# Patient Record
Sex: Male | Born: 1970
Health system: Southern US, Community
[De-identification: ages and names within clinical notes are randomized; demographics above are authoritative.]

## PROBLEM LIST (undated history)

## (undated) DIAGNOSIS — Q676 Pectus excavatum: Secondary | ICD-10-CM

## (undated) DIAGNOSIS — M722 Plantar fascial fibromatosis: Secondary | ICD-10-CM

## (undated) DIAGNOSIS — I10 Essential (primary) hypertension: Secondary | ICD-10-CM

## (undated) DIAGNOSIS — I639 Cerebral infarction, unspecified: Secondary | ICD-10-CM

## (undated) HISTORY — PX: WISDOM TOOTH EXTRACTION: SHX21

## (undated) HISTORY — PX: PECTUS EXCAVATUM REPAIR: SHX437

---

## 2011-05-30 HISTORY — PX: HERNIA REPAIR: SHX51

## 2011-12-14 ENCOUNTER — Ambulatory Visit: Payer: Self-pay | Admitting: Surgery

## 2011-12-21 ENCOUNTER — Ambulatory Visit: Payer: Self-pay | Admitting: Surgery

## 2012-08-30 ENCOUNTER — Ambulatory Visit: Payer: Self-pay | Admitting: Internal Medicine

## 2014-03-25 IMAGING — CT CT ABD-PELV W/ CM
1 of 2 series · 15 of 32 positions shown, 19 images · non-contrast
Comparison: none

REASON FOR EXAM: abd and pelvic pain
COMMENTS:

[Series 2: abd 3mm w 3.0 i40f 3 · axial · 0.77mm/px · z∈[-1061,-608]mm · 15 of 165 slices shown, 19 images]
[im 7/165  soft-tissue]
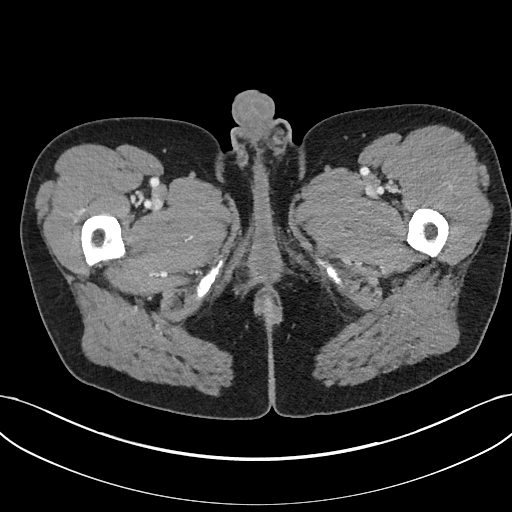
[im 7/165  bone]
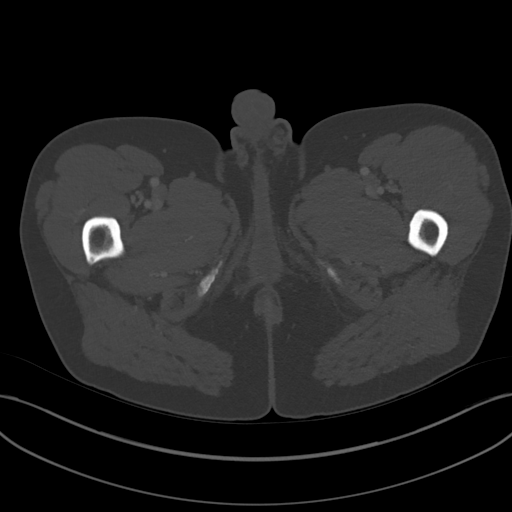
[im 21/165  soft-tissue]
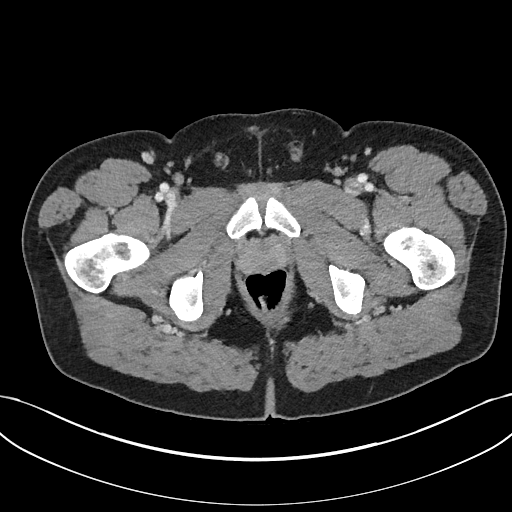
[im 35/165  soft-tissue]
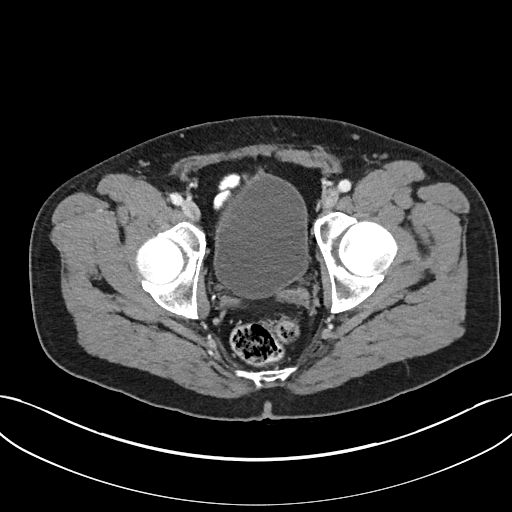
[im 48/165  soft-tissue]
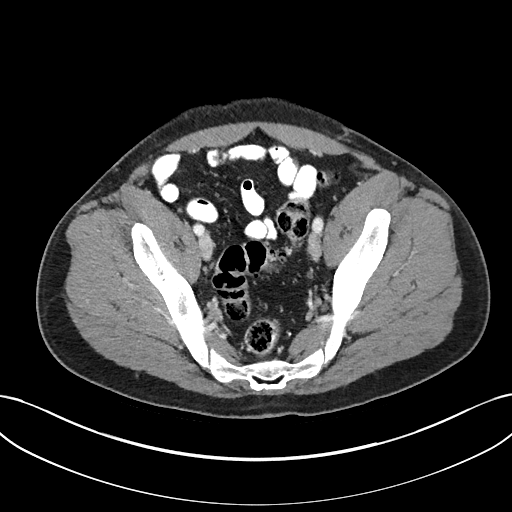
[im 55/165  soft-tissue]
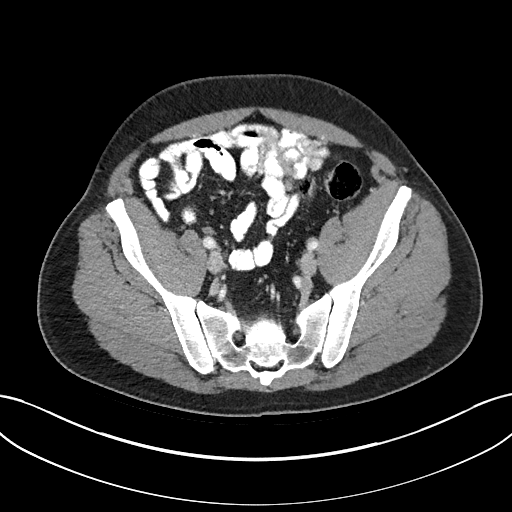
[im 69/165  soft-tissue]
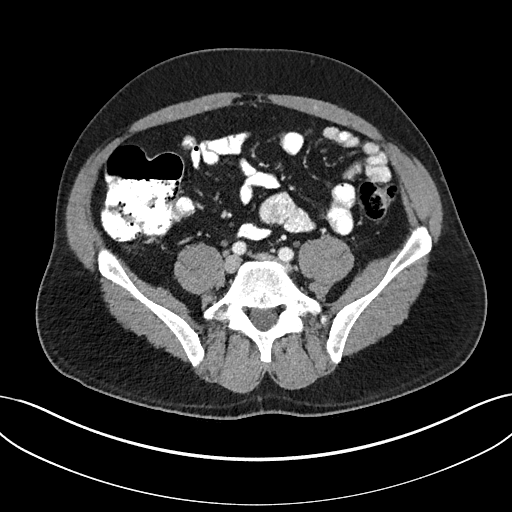
[im 83/165  soft-tissue]
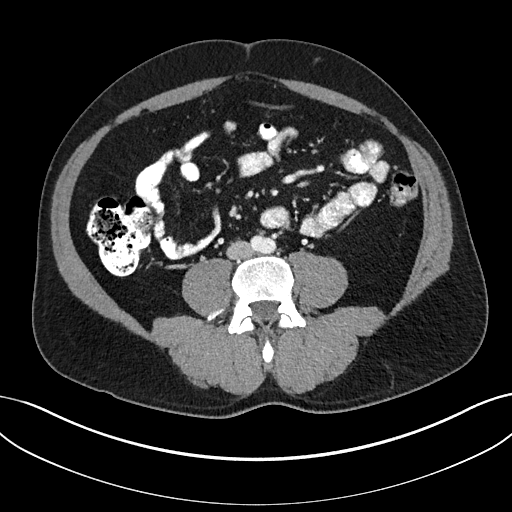
[im 96/165  soft-tissue]
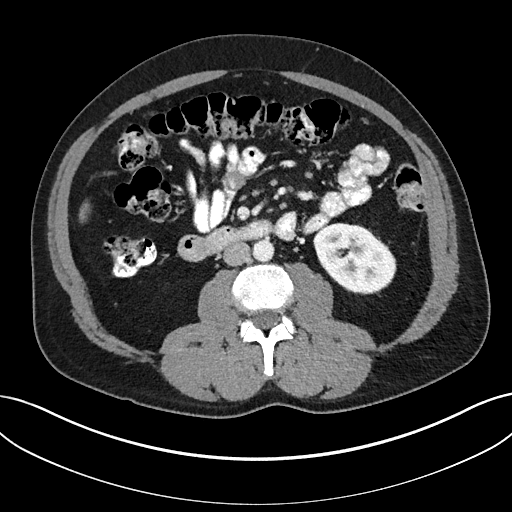
[im 110/165  soft-tissue]
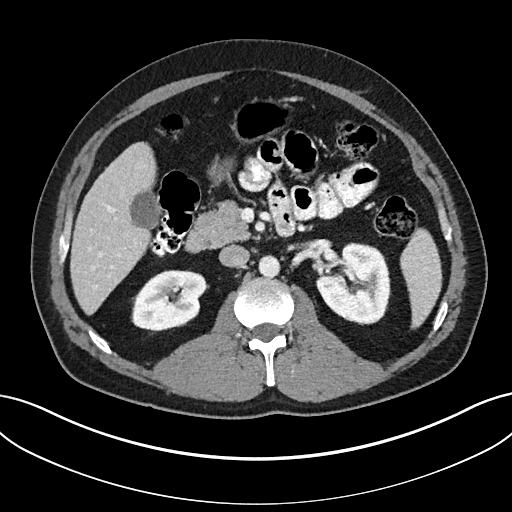
[im 110/165  bone]
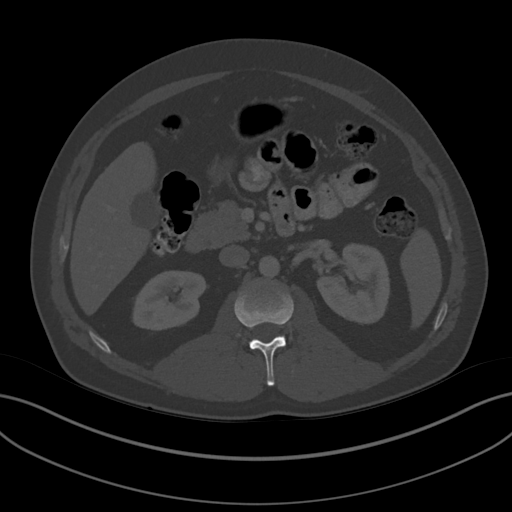
[im 117/165  soft-tissue]
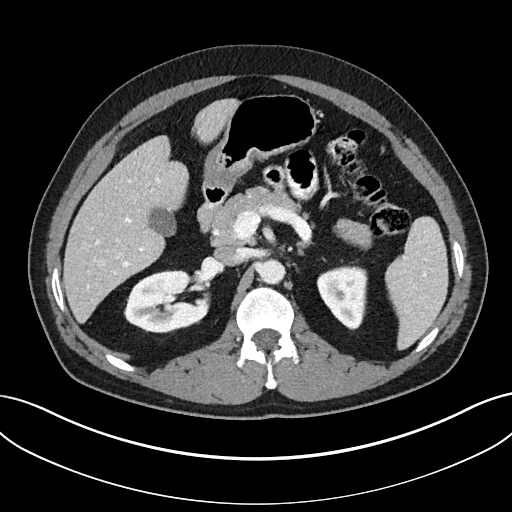
[im 130/165  soft-tissue]
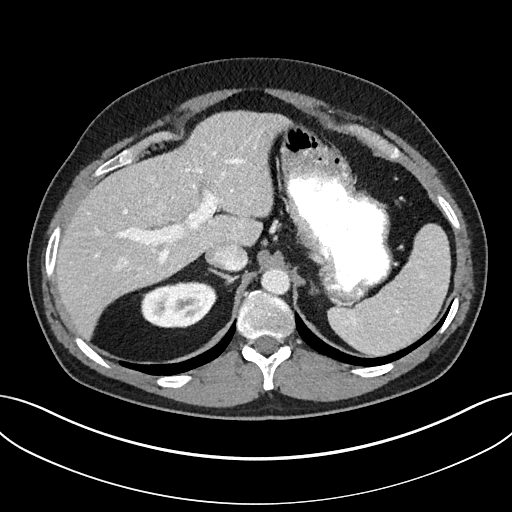
[im 137/165  lung]
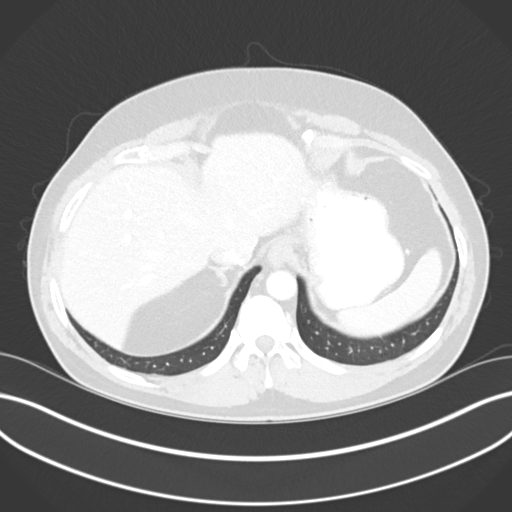
[im 144/165  soft-tissue]
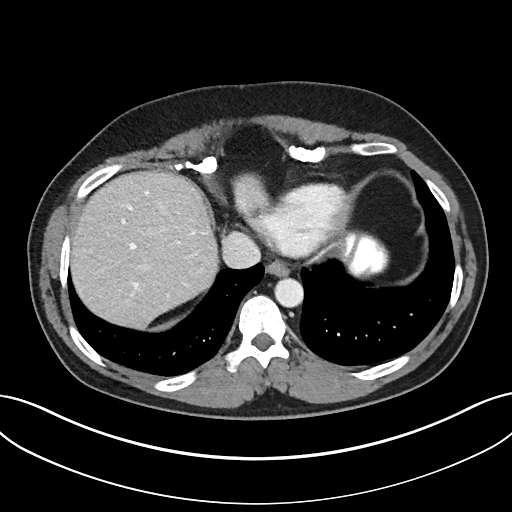
[im 144/165  lung]
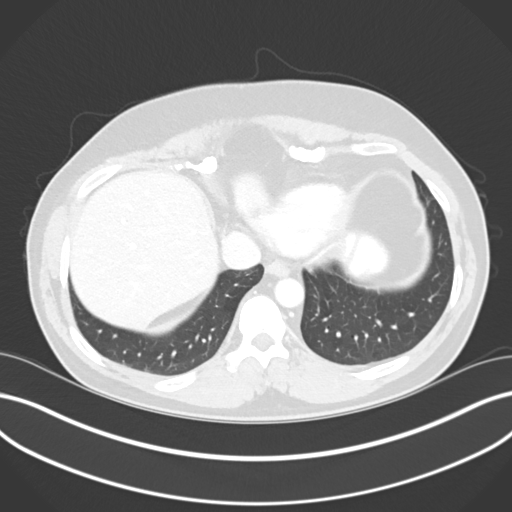
[im 151/165  lung]
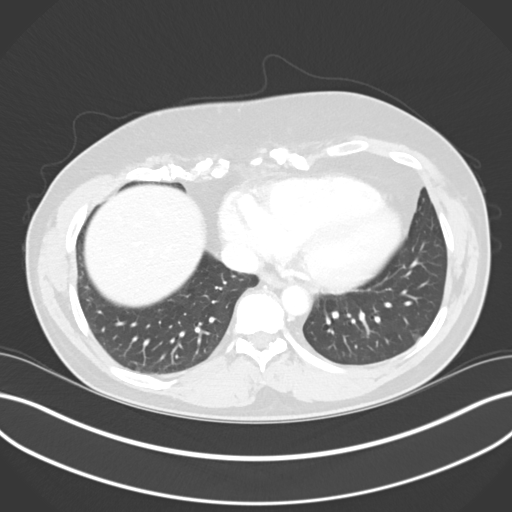
[im 158/165  soft-tissue]
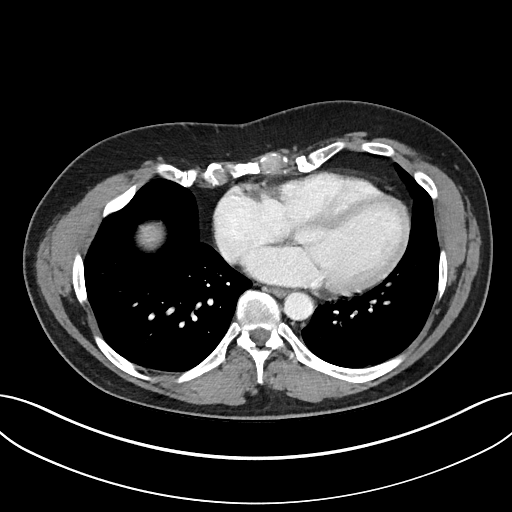
[im 158/165  lung]
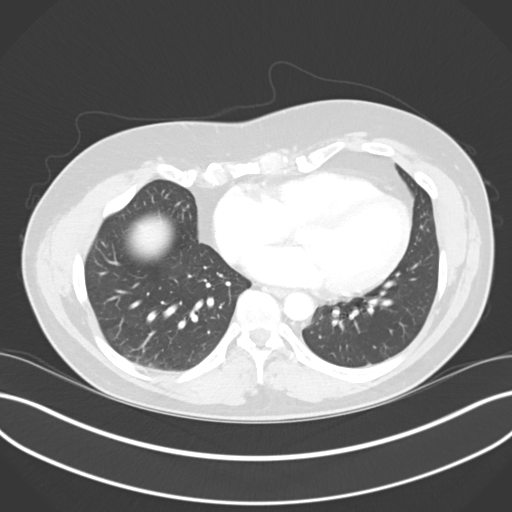

[15 of 32 positions shown; findings below may reference images not displayed]

PROCEDURE:     KCT - KCT ABDOMEN/PELVIS W  - August 30, 2012 [DATE]

RESULT:     Axial CT scanning was performed through the abdomen and pelvis
with reconstructions at 3 mm intervals lies. The patient received 100 cc of
Ssovue-4TO and also received oral contrast material. Review of multiplanar
reconstructed images was performed separately on the VIA monitor.

The liver, gallbladder, spleen, pancreas, partially distended stomach,
adrenal glands, and kidneys are normal in appearance. The caliber of the
abdominal aorta is normal. There is no periaortic nor pericaval
lymphadenopathy.

The partially contrast-filled loops of small and large bowel are normal in
appearance. A normal calibered uninflamed appendix is demonstrated. There is
no evidence of acute colitis or diverticulitis. There is no evidence of a
recurrent inguinal hernia. There is a small fat containing umbilical hernia.
The partially distended urinary bladder is normal in appearance. The
prostate gland and seminal vesicles are normal in appearance for age. There
are phleboliths within the pelvis. No urinary tract stone is demonstrated.

The lung bases exhibit no acute abnormalities. The lumbar vertebral bodies
are preserved in height.
IMPRESSION: 1. There is no evidence of a recurrent inguinal hernia. No acute abnormality
of the small or large bowel is demonstrated. There is a small fat containing
umbilical hernia.
2. There is no evidence of acute urinary tract abnormality.
3. There is no acute hepatobiliary abnormality.

[REDACTED]

## 2014-09-15 NOTE — Op Note (Signed)
PATIENT NAME:  Roger Reeves, Roger Reeves MR#:  [id number] DATE OF BIRTH:  May 16, 1971  DATE OF PROCEDURE:  12/21/2011  PREOPERATIVE DIAGNOSIS: Bilateral inguinal hernias.   POSTOPERATIVE DIAGNOSIS: Bilateral inguinal hernias.   PROCEDURE: Bilateral inguinal hernia repair.  SURGEON: Rochel Brome, MD  ANESTHESIA: General, local.   INDICATIONS: This 44 year old male has a history of some pain in the left groin. He had physical findings of bilateral inguinal hernias and repair was recommended for definitive treatment. The patient also had previous history of fever related to general anesthesia, at the age of 31, and it was elected to do this with local with intravenous sedation hopefully to avoid malignant hyperthermia.   DESCRIPTION OF PROCEDURE: The patient was placed on the operating table in the supine position and sedated. The hair was clipped. The abdomen was prepared with ChloraPrep and draped in a sterile manner.   The sites for the incisions were marked with ink so that we made bilateral transversely oriented suprapubic incisions beginning initially on the left side.   The skin was infiltrated with 1% Xylocaine with epinephrine. The transversely oriented left suprapubic incision was made and carried down through subcutaneous tissues. One traversing vein was divided between 4-0 Vicryl ligatures. Additional Xylocaine with epinephrine was injected into the Scarpa's fascia. The Scarpa's fascia was incised and dissection was carried down to the external oblique aponeurosis. This was injected with Xylocaine with epinephrine superior and lateral to the repair site. Next, the external ring was identified. The external oblique aponeurosis was incised along the course of its fibers to open the external ring and expose the inguinal cord structures. The inguinal cord structures and ilioinguinal nerve were mobilized. The cremaster fibers were spread examining the cord structures. There was a small cord lipoma but no  indirect component. There was however a finding of a large direct inguinal hernia with a large defect. The small cord lipoma was dissected up to the internal ring and ligated with 4-0 Vicryl and amputated. Next, the direct inguinal hernia was further delineated and its bulge was approximately 4 cm. It was inverted and repair was carried out with a row of 0 Surgilon sutures suturing the conjoined tendon to the shelving edge of the inguinal ligament incorporating transversalis fascia into the repair. This suture line began at the pubic tubercle and was carried up to the internal ring. The repair looked good. A relaxing incision was made medially in the internal oblique fascia. Next, an onlay Atrium mesh was cut to create an oval shape of some 2.5 x 3.5 cm in dimension. A U-shaped notch was cut out for the internal ring. The mesh was placed onto the floor of the inguinal canal. It was sutured on both sides of the internal ring and also sutured to the repair and also sutured medially to the medial aspect of the relaxing incision. The repair looked good. Hemostasis was intact. Cord structures and ilioinguinal nerve were replaced along the floor of the inguinal canal. Cut edges of the external oblique aponeurosis were closed with a running 4-0 Vicryl. Next, 10 mL of 0.5% Sensorcaine was injected into the deep fascia superior and lateral to the repair site and also subcutaneous tissues were infiltrated with another 5 mL. Next, the Scarpa's fascia was closed with interrupted 4-0 Vicryl. The skin was closed with running 4-0 Monocryl subcuticular suture.   The patient was in satisfactory condition and attention was turned to the right side where the skin was infiltrated with Xylocaine with epinephrine and a mirror image  incision was made and carried down through subcutaneous tissues. One traversing vein was divided between 4-0 Vicryl ligatures. Scarpa's fascia was injected and incised. The external oblique aponeurosis was  injected likewise and incised along the course of its fibers to open the external ring and expose the inguinal cord structures. These structures and the ilioinguinal nerve were mobilized. There was a somewhat smaller direct inguinal hernia, no significant cord lipoma, no indirect component. This direct inguinal hernia was also inverted and repaired with a row of 0 Surgilon sutures suturing the conjoined tendon to the shelving edge of the inguinal ligament incorporating transversalis fascia into the repair. A relaxing incision was made medially and a similar mesh was cut and sewn into place with 0 Surgilon sutures. The repair looked good. Cord structures and ilioinguinal nerve were replaced along the floor of the inguinal canal. Cut edges of the external oblique aponeurosis were closed with running 4-0 Vicryl. The fascia superior and lateral to the repair site was infiltrated with Sensorcaine, also subcutaneous tissues infiltrated. Scarpa's fascia was closed with interrupted 4-0 Vicryl. The skin was closed with running 4-0 Monocryl subcuticular suture.          Both wounds were then dressed with Dermabond and allowed to dry. The patient tolerated the procedure satisfactorily. Testicles remained in the scrotum. The patient was prepared for transfer to the recovery room. ____________________________ Lenna Sciara. Rochel Brome, MD jws:slb D: 12/21/2011 12:26:26 ET T: 12/21/2011 12:50:02 ET JOB#: [id number]  cc: Loreli Dollar, MD, <Dictator> Loreli Dollar MD ELECTRONICALLY SIGNED 12/21/2011 22:02

## 2015-06-25 DIAGNOSIS — R079 Chest pain, unspecified: Secondary | ICD-10-CM | POA: Diagnosis not present

## 2015-06-25 DIAGNOSIS — R0789 Other chest pain: Secondary | ICD-10-CM | POA: Diagnosis not present

## 2015-06-25 DIAGNOSIS — R0781 Pleurodynia: Secondary | ICD-10-CM | POA: Diagnosis not present

## 2015-06-25 DIAGNOSIS — I10 Essential (primary) hypertension: Secondary | ICD-10-CM | POA: Diagnosis not present

## 2015-10-11 DIAGNOSIS — I10 Essential (primary) hypertension: Secondary | ICD-10-CM | POA: Diagnosis not present

## 2015-10-11 DIAGNOSIS — Z79899 Other long term (current) drug therapy: Secondary | ICD-10-CM | POA: Diagnosis not present

## 2015-10-11 DIAGNOSIS — Z125 Encounter for screening for malignant neoplasm of prostate: Secondary | ICD-10-CM | POA: Diagnosis not present

## 2015-10-11 DIAGNOSIS — E7800 Pure hypercholesterolemia, unspecified: Secondary | ICD-10-CM | POA: Diagnosis not present

## 2015-10-11 DIAGNOSIS — Z0000 Encounter for general adult medical examination without abnormal findings: Secondary | ICD-10-CM | POA: Diagnosis not present

## 2015-10-27 DIAGNOSIS — I10 Essential (primary) hypertension: Secondary | ICD-10-CM | POA: Diagnosis not present

## 2015-10-27 DIAGNOSIS — R0609 Other forms of dyspnea: Secondary | ICD-10-CM | POA: Diagnosis not present

## 2015-12-14 DIAGNOSIS — R0602 Shortness of breath: Secondary | ICD-10-CM | POA: Diagnosis not present

## 2015-12-14 DIAGNOSIS — R5383 Other fatigue: Secondary | ICD-10-CM | POA: Diagnosis not present

## 2015-12-14 DIAGNOSIS — R5381 Other malaise: Secondary | ICD-10-CM | POA: Diagnosis not present

## 2015-12-14 DIAGNOSIS — I10 Essential (primary) hypertension: Secondary | ICD-10-CM | POA: Diagnosis not present

## 2015-12-29 DIAGNOSIS — I10 Essential (primary) hypertension: Secondary | ICD-10-CM | POA: Diagnosis not present

## 2016-04-10 DIAGNOSIS — I10 Essential (primary) hypertension: Secondary | ICD-10-CM | POA: Diagnosis not present

## 2016-04-10 DIAGNOSIS — Z125 Encounter for screening for malignant neoplasm of prostate: Secondary | ICD-10-CM | POA: Diagnosis not present

## 2016-04-10 DIAGNOSIS — Z79899 Other long term (current) drug therapy: Secondary | ICD-10-CM | POA: Diagnosis not present

## 2016-04-10 DIAGNOSIS — E7800 Pure hypercholesterolemia, unspecified: Secondary | ICD-10-CM | POA: Diagnosis not present

## 2016-10-12 DIAGNOSIS — E7800 Pure hypercholesterolemia, unspecified: Secondary | ICD-10-CM | POA: Diagnosis not present

## 2016-10-12 DIAGNOSIS — Z0000 Encounter for general adult medical examination without abnormal findings: Secondary | ICD-10-CM | POA: Diagnosis not present

## 2016-10-12 DIAGNOSIS — Z125 Encounter for screening for malignant neoplasm of prostate: Secondary | ICD-10-CM | POA: Diagnosis not present

## 2016-10-12 DIAGNOSIS — I10 Essential (primary) hypertension: Secondary | ICD-10-CM | POA: Diagnosis not present

## 2017-07-03 ENCOUNTER — Ambulatory Visit: Payer: Self-pay

## 2017-07-03 ENCOUNTER — Ambulatory Visit (INDEPENDENT_AMBULATORY_CARE_PROVIDER_SITE_OTHER): Payer: Self-pay | Admitting: Emergency Medicine

## 2017-07-03 VITALS — BP 138/78 | HR 82 | Temp 98.0°F | Wt 200.0 lb

## 2017-07-03 DIAGNOSIS — H6123 Impacted cerumen, bilateral: Secondary | ICD-10-CM

## 2017-07-03 MED ORDER — CIPROFLOXACIN-DEXAMETHASONE 0.3-0.1 % OT SUSP: 4.0000 [drp] | Freq: Two times a day (BID) | OTIC | 0 refills | Status: DC

## 2017-07-03 NOTE — Progress Notes (Signed)
Subjective:    Roger Reeves is a 47 y.o. male who presents for evaluation of diminished hearing and pressure in both ears for the past 5 days. There is a prior history of cerumen impaction. The patient has been using ear drops to loosen wax immediately prior to this visit. The patient denies ear pain.  The patient's history has been marked as reviewed and updated as appropriate.  Review of Systems Pertinent items are noted in HPI.    Objective:    Auditory canal(s) of both ears are completely obstructed with cerumen.   Cerumen was removed using gentle irrigation and soft plastic curettes. Tympanic membranes are intact following the procedure.  Auditory canals are bleeding and inflamed.  Residual wax remains in both ears.   Assessment:    Cerumen Impaction without otitis externa.    Plan:    1. Care instructions given. 2. Home treatment: Ciprodex and Debrox. 3. Return in one week for reevaluaiton, further removal.

## 2017-07-03 NOTE — Patient Instructions (Addendum)
Return in one week for reevaluation. Ciprodex 4 drops each ear twice a day, Debrox, 5-10 drops, both ears, soak for 15 minutes at a time 4 times a day.    Warwax Buildup, Adult The ears produce a substance called earwax that helps keep bacteria out of the ear and protects the skin in the ear canal. Occasionally, earwax can build up in the ear and cause discomfort or hearing loss. What increases the risk? This condition is more likely to develop in people who:  Are male.  Are elderly.  Naturally produce more earwax.  Clean their ears often with cotton swabs.  Use earplugs often.  Use in-ear headphones often.  Wear hearing aids.  Have narrow ear canals.  Have earwax that is overly thick or sticky.  Have eczema.  Are dehydrated.  Have excess hair in the ear canal.  What are the signs or symptoms? Symptoms of this condition include:  Reduced or muffled hearing.  A feeling of fullness in the ear or feeling that the ear is plugged.  Fluid coming from the ear.  Ear pain.  Ear itch.  Ringing in the ear.  Coughing.  An obvious piece of earwax that can be seen inside the ear canal.  How is this diagnosed? This condition may be diagnosed based on:  Your symptoms.  Your medical history.  An ear exam. During the exam, your health care provider will look into your ear with an instrument called an otoscope.  You may have tests, including a hearing test. How is this treated? This condition may be treated by:  Using ear drops to soften the earwax.  Having the earwax removed by a health care provider. The health care provider may: ? Flush the ear with water. ? Use an instrument that has a loop on the end (curette). ? Use a suction device.  Surgery to remove the wax buildup. This may be done in severe cases.  Follow these instructions at home:  Take over-the-counter and prescription medicines only as told by your health care provider.  Do not put any  objects, including cotton swabs, into your ear. You can clean the opening of your ear canal with a washcloth or facial tissue.  Follow instructions from your health care provider about cleaning your ears. Do not over-clean your ears.  Drink enough fluid to keep your urine clear or pale yellow. This will help to thin the earwax.  Keep all follow-up visits as told by your health care provider. If earwax builds up in your ears often or if you use hearing aids, consider seeing your health care provider for routine, preventive ear cleanings. Ask your health care provider how often you should schedule your cleanings.  If you have hearing aids, clean them according to instructions from the manufacturer and your health care provider. Contact a health care provider if:  You have ear pain.  You develop a fever.  You have blood, pus, or other fluid coming from your ear.  You have hearing loss.  You have ringing in your ears that does not go away.  Your symptoms do not improve with treatment.  You feel like the room is spinning (vertigo). Summary  Earwax can build up in the ear and cause discomfort or hearing loss.  The most common symptoms of this condition include reduced or muffled hearing and a feeling of fullness in the ear or feeling that the ear is plugged.  This condition may be diagnosed based on your symptoms,  your medical history, and an ear exam.  This condition may be treated by using ear drops to soften the earwax or by having the earwax removed by a health care provider.  Do not put any objects, including cotton swabs, into your ear. You can clean the opening of your ear canal with a washcloth or facial tissue. This information is not intended to replace advice given to you by your health care provider. Make sure you discuss any questions you have with your health care provider. Document Released: 06/22/2004 Document Revised: 07/26/2016 Document Reviewed: 07/26/2016 Elsevier  Interactive Patient Education  [employer].

## 2017-07-05 ENCOUNTER — Other Ambulatory Visit: Payer: Self-pay | Admitting: Family Medicine

## 2017-07-05 MED ORDER — TOBRAMYCIN-DEXAMETHASONE 0.3-0.1 % OP SUSP: 2.0000 [drp] | Freq: Four times a day (QID) | OPHTHALMIC | 0 refills | Status: DC

## 2017-07-05 NOTE — Progress Notes (Signed)
Pt can't afford previously prescribed ciprodex, pharmacy called requesting medication be changed to Torbadex which was e-prescribed to [facility]

## 2017-07-10 ENCOUNTER — Ambulatory Visit (INDEPENDENT_AMBULATORY_CARE_PROVIDER_SITE_OTHER): Payer: Self-pay | Admitting: Emergency Medicine

## 2017-07-10 VITALS — BP 148/89 | HR 79 | Temp 97.9°F

## 2017-07-10 DIAGNOSIS — H6123 Impacted cerumen, bilateral: Secondary | ICD-10-CM

## 2017-07-10 NOTE — Progress Notes (Signed)
S: Roger Reeves is a 47 y.o. male who presents for follow up care for bilateral cerumen impaction. I saw Roger Reeves one week ago for this condition. Ears were soaked with docusate, flushed, and wax was attempted to be removed with curette. We were unable to clear his cerumen at that time, recommended one week of treatment with debrox and return today for re-evaluation. He reports his right ear is feeling better but he still can't hear out of his left ear. He has no dizziness, fever, chills, or systemic symptoms.  Review of Systems  Constitutional: Negative for chills and fever.  HENT: Positive for ear pain, hearing loss and tinnitus. Negative for ear discharge.   Neurological: Negative for dizziness and headaches.    O: Vitals:   07/10/17 1509  BP: (!) 148/89  Pulse: 79  Temp: 97.9 F (36.6 C)  SpO2: 97%   Physical Exam  Constitutional: He appears well-developed and well-nourished. No distress.  HENT:  Head: Normocephalic and atraumatic.  Right Ear: External ear normal.  Left Ear: External ear normal.  Mouth/Throat: Oropharynx is clear and moist.  Right auditory cannal completely filled with cerumen Left auditory cannal, partially filled with cerumen   Neurological: He is alert.  Skin: Skin is warm.  Nursing note and vitals reviewed.  A: 1. Bilateral impacted cerumen    P: Pt requested only to attempt one ear today, consent obtained, right auditory canal allowed to soak with docusate for 15 minutes, and canal flushed, some wax was removed under direct visualization with curette, however the canal remains obstructed. Procedure terminated at patients request. Will refer to ENT for further care.

## 2017-07-10 NOTE — Patient Instructions (Signed)
Earwax Buildup, Adult The ears produce a substance called earwax that helps keep bacteria out of the ear and protects the skin in the ear canal. Occasionally, earwax can build up in the ear and cause discomfort or hearing loss. What increases the risk? This condition is more likely to develop in people who:  Are male.  Are elderly.  Naturally produce more earwax.  Clean their ears often with cotton swabs.  Use earplugs often.  Use in-ear headphones often.  Wear hearing aids.  Have narrow ear canals.  Have earwax that is overly thick or sticky.  Have eczema.  Are dehydrated.  Have excess hair in the ear canal.  What are the signs or symptoms? Symptoms of this condition include:  Reduced or muffled hearing.  A feeling of fullness in the ear or feeling that the ear is plugged.  Fluid coming from the ear.  Ear pain.  Ear itch.  Ringing in the ear.  Coughing.  An obvious piece of earwax that can be seen inside the ear canal.  How is this diagnosed? This condition may be diagnosed based on:  Your symptoms.  Your medical history.  An ear exam. During the exam, your health care provider will look into your ear with an instrument called an otoscope.  You may have tests, including a hearing test. How is this treated? This condition may be treated by:  Using ear drops to soften the earwax.  Having the earwax removed by a health care provider. The health care provider may: ? Flush the ear with water. ? Use an instrument that has a loop on the end (curette). ? Use a suction device.  Surgery to remove the wax buildup. This may be done in severe cases.  Follow these instructions at home:  Take over-the-counter and prescription medicines only as told by your health care provider.  Do not put any objects, including cotton swabs, into your ear. You can clean the opening of your ear canal with a washcloth or facial tissue.  Follow instructions from your health  care provider about cleaning your ears. Do not over-clean your ears.  Drink enough fluid to keep your urine clear or pale yellow. This will help to thin the earwax.  Keep all follow-up visits as told by your health care provider. If earwax builds up in your ears often or if you use hearing aids, consider seeing your health care provider for routine, preventive ear cleanings. Ask your health care provider how often you should schedule your cleanings.  If you have hearing aids, clean them according to instructions from the manufacturer and your health care provider. Contact a health care provider if:  You have ear pain.  You develop a fever.  You have blood, pus, or other fluid coming from your ear.  You have hearing loss.  You have ringing in your ears that does not go away.  Your symptoms do not improve with treatment.  You feel like the room is spinning (vertigo). Summary  Earwax can build up in the ear and cause discomfort or hearing loss.  The most common symptoms of this condition include reduced or muffled hearing and a feeling of fullness in the ear or feeling that the ear is plugged.  This condition may be diagnosed based on your symptoms, your medical history, and an ear exam.  This condition may be treated by using ear drops to soften the earwax or by having the earwax removed by a health care provider.  Do   not put any objects, including cotton swabs, into your ear. You can clean the opening of your ear canal with a washcloth or facial tissue. This information is not intended to replace advice given to you by your health care provider. Make sure you discuss any questions you have with your health care provider. Document Released: 06/22/2004 Document Revised: 07/26/2016 Document Reviewed: 07/26/2016 Elsevier Interactive Patient Education  2018 Elsevier Inc.  

## 2019-07-07 ENCOUNTER — Other Ambulatory Visit: Payer: Self-pay

## 2019-07-07 ENCOUNTER — Telehealth: Payer: Self-pay

## 2019-07-07 DIAGNOSIS — Z1211 Encounter for screening for malignant neoplasm of colon: Secondary | ICD-10-CM

## 2019-07-07 NOTE — Telephone Encounter (Signed)
Gastroenterology Pre-Procedure Review  Request Date: Friday 08/01/19 Requesting Physician: Dr. Allen Norris  PATIENT REVIEW QUESTIONS: The patient responded to the following health history questions as indicated:    1. Are you having any GI issues? no 2. Do you have a personal history of Polyps? no 3. Do you have a family history of Colon Cancer or Polyps? yes (father colon polyps) 4. Diabetes Mellitus? no 5. Joint replacements in the past 12 months?no 6. Major health problems in the past 3 months?no 7. Any artificial heart valves, MVP, or defibrillator?no    MEDICATIONS & ALLERGIES:    Patient reports the following regarding taking any anticoagulation/antiplatelet therapy:   Plavix, Coumadin, Eliquis, Xarelto, Lovenox, Pradaxa, Brilinta, or Effient? no Aspirin? no  Patient confirms/reports the following medications:  Current Outpatient Medications  Medication Sig Dispense Refill  . tobramycin-dexamethasone (TOBRADEX) ophthalmic solution Place 2 drops into both ears 4 (four) times daily. 5 mL 0   No current facility-administered medications for this visit.    Patient confirms/reports the following allergies:  Not on File  No orders of the defined types were placed in this encounter.   AUTHORIZATION INFORMATION Primary Insurance: 1D#: Group #:  Secondary Insurance: 1D#: Group #:  SCHEDULE INFORMATION: Date: Friday 08/01/19 Time: Location:[facility]

## 2019-08-05 ENCOUNTER — Encounter: Payer: Self-pay | Admitting: Gastroenterology

## 2019-08-05 ENCOUNTER — Other Ambulatory Visit: Payer: Self-pay

## 2019-08-07 ENCOUNTER — Other Ambulatory Visit: Payer: Self-pay

## 2019-08-07 ENCOUNTER — Other Ambulatory Visit
Admission: RE | Admit: 2019-08-07 | Discharge: 2019-08-07 | Disposition: A | Discharge status: Home / Self Care | Payer: BC Managed Care – PPO | Source: Ambulatory Visit | Attending: Gastroenterology | Admitting: Gastroenterology

## 2019-08-07 DIAGNOSIS — Z20822 Contact with and (suspected) exposure to covid-19: Secondary | ICD-10-CM | POA: Diagnosis not present

## 2019-08-07 DIAGNOSIS — Z01812 Encounter for preprocedural laboratory examination: Secondary | ICD-10-CM | POA: Diagnosis not present

## 2019-08-07 LAB — SARS CORONAVIRUS 2 (TAT 6-24 HRS): SARS Coronavirus 2: NEGATIVE

## 2019-08-07 NOTE — Discharge Instructions (Signed)
General Anesthesia, Adult, Care After This sheet gives you information about how to care for yourself after your procedure. Your health care provider may also give you more specific instructions. If you have problems or questions, contact your health care provider. What can I expect after the procedure? After the procedure, the following side effects are common:  Pain or discomfort at the IV site.  Nausea.  Vomiting.  Sore throat.  Trouble concentrating.  Feeling cold or chills.  Weak or tired.  Sleepiness and fatigue.  Soreness and body aches. These side effects can affect parts of the body that were not involved in surgery. Follow these instructions at home:  For at least 24 hours after the procedure:  Have a responsible adult stay with you. It is important to have someone help care for you until you are awake and alert.  Rest as needed.  Do not: ? Participate in activities in which you could fall or become injured. ? Drive. ? Use heavy machinery. ? Drink alcohol. ? Take sleeping pills or medicines that cause drowsiness. ? Make important decisions or sign legal documents. ? Take care of children on your own. Eating and drinking  Follow any instructions from your health care provider about eating or drinking restrictions.  When you feel hungry, start by eating small amounts of foods that are soft and easy to digest (bland), such as toast. Gradually return to your regular diet.  Drink enough fluid to keep your urine pale yellow.  If you vomit, rehydrate by drinking water, juice, or clear broth. General instructions  If you have sleep apnea, surgery and certain medicines can increase your risk for breathing problems. Follow instructions from your health care provider about wearing your sleep device: ? Anytime you are sleeping, including during daytime naps. ? While taking prescription pain medicines, sleeping medicines, or medicines that make you drowsy.  Return to  your normal activities as told by your health care provider. Ask your health care provider what activities are safe for you.  Take over-the-counter and prescription medicines only as told by your health care provider.  If you smoke, do not smoke without supervision.  Keep all follow-up visits as told by your health care provider. This is important. Contact a health care provider if:  You have nausea or vomiting that does not get better with medicine.  You cannot eat or drink without vomiting.  You have pain that does not get better with medicine.  You are unable to pass urine.  You develop a skin rash.  You have a fever.  You have redness around your IV site that gets worse. Get help right away if:  You have difficulty breathing.  You have chest pain.  You have blood in your urine or stool, or you vomit blood. Summary  After the procedure, it is common to have a sore throat or nausea. It is also common to feel tired.  Have a responsible adult stay with you for the first 24 hours after general anesthesia. It is important to have someone help care for you until you are awake and alert.  When you feel hungry, start by eating small amounts of foods that are soft and easy to digest (bland), such as toast. Gradually return to your regular diet.  Drink enough fluid to keep your urine pale yellow.  Return to your normal activities as told by your health care provider. Ask your health care provider what activities are safe for you. This information is not   intended to replace advice given to you by your health care provider. Make sure you discuss any questions you have with your health care provider. Document Revised: 05/18/2017 Document Reviewed: 12/29/2016 Elsevier Patient Education  2020 Elsevier Inc.  

## 2019-08-11 ENCOUNTER — Ambulatory Visit: Payer: BC Managed Care – PPO | Admitting: Anesthesiology

## 2019-08-11 ENCOUNTER — Encounter
Admission: RE | Disposition: A | Discharge status: Home / Self Care | Payer: Self-pay | Source: Home / Self Care | Attending: Gastroenterology

## 2019-08-11 ENCOUNTER — Encounter: Payer: Self-pay | Admitting: Gastroenterology

## 2019-08-11 ENCOUNTER — Ambulatory Visit
Admission: RE | Admit: 2019-08-11 | Discharge: 2019-08-11 | Disposition: A | Discharge status: Home / Self Care | Payer: BC Managed Care – PPO | Attending: Gastroenterology | Admitting: Gastroenterology

## 2019-08-11 ENCOUNTER — Other Ambulatory Visit: Payer: Self-pay

## 2019-08-11 DIAGNOSIS — Z1211 Encounter for screening for malignant neoplasm of colon: Secondary | ICD-10-CM | POA: Diagnosis present

## 2019-08-11 DIAGNOSIS — K635 Polyp of colon: Secondary | ICD-10-CM

## 2019-08-11 DIAGNOSIS — D122 Benign neoplasm of ascending colon: Secondary | ICD-10-CM | POA: Diagnosis not present

## 2019-08-11 DIAGNOSIS — Z79899 Other long term (current) drug therapy: Secondary | ICD-10-CM | POA: Diagnosis not present

## 2019-08-11 DIAGNOSIS — K5730 Diverticulosis of large intestine without perforation or abscess without bleeding: Secondary | ICD-10-CM | POA: Insufficient documentation

## 2019-08-11 DIAGNOSIS — K640 First degree hemorrhoids: Secondary | ICD-10-CM | POA: Insufficient documentation

## 2019-08-11 HISTORY — PX: POLYPECTOMY: SHX5525

## 2019-08-11 HISTORY — DX: Pectus excavatum: Q67.6

## 2019-08-11 HISTORY — DX: Plantar fascial fibromatosis: M72.2

## 2019-08-11 HISTORY — PX: COLONOSCOPY WITH PROPOFOL: SHX5780

## 2019-08-11 SURGERY — COLONOSCOPY WITH PROPOFOL
Anesthesia: General | Site: Rectum

## 2019-08-11 MED ORDER — ONDANSETRON HCL 4 MG/2ML IJ SOLN: 4.0000 mg | Freq: Once | INTRAMUSCULAR | Status: DC | PRN

## 2019-08-11 MED ORDER — PROPOFOL 10 MG/ML IV BOLUS
INTRAVENOUS | Status: DC | PRN
  Administered 2019-08-11: 30 mg via INTRAVENOUS
  Administered 2019-08-11: 50 mg via INTRAVENOUS
  Administered 2019-08-11: 100 mg via INTRAVENOUS
  Administered 2019-08-11: 30 mg via INTRAVENOUS
  Administered 2019-08-11: 50 mg via INTRAVENOUS
  Administered 2019-08-11 (×2): 30 mg via INTRAVENOUS

## 2019-08-11 MED ORDER — LIDOCAINE HCL (CARDIAC) PF 100 MG/5ML IV SOSY
PREFILLED_SYRINGE | INTRAVENOUS | Status: DC | PRN
  Administered 2019-08-11: 30 mg via INTRAVENOUS

## 2019-08-11 MED ORDER — STERILE WATER FOR IRRIGATION IR SOLN
Status: DC | PRN
  Administered 2019-08-11: 50 mL

## 2019-08-11 MED ORDER — LACTATED RINGERS IV SOLN: INTRAVENOUS | Status: DC

## 2019-08-11 SURGICAL SUPPLY — 8 items
CANISTER SUCT 1200ML W/VALVE (MISCELLANEOUS) ×3 IMPLANT
FORCEPS BIOP RAD 4 LRG CAP 4 (CUTTING FORCEPS) ×3 IMPLANT
GOWN CVR UNV OPN BCK APRN NK (MISCELLANEOUS) ×2 IMPLANT
GOWN ISOL THUMB LOOP REG UNIV (MISCELLANEOUS) ×4
KIT ENDO PROCEDURE OLY (KITS) ×3 IMPLANT
SNARE SHORT THROW 13M SML OVAL (MISCELLANEOUS) ×3 IMPLANT
TRAP ETRAP POLY (MISCELLANEOUS) ×3 IMPLANT
WATER STERILE IRR 250ML POUR (IV SOLUTION) ×3 IMPLANT

## 2019-08-11 NOTE — Anesthesia Preprocedure Evaluation (Signed)
Anesthesia Evaluation  Patient identified by MRN, date of birth, ID band Patient awake    History of Anesthesia Complications Negative for: history of anesthetic complications  Airway Mallampati: II  TM Distance: >3 FB Neck ROM: Full    Dental no notable dental hx.    Pulmonary neg pulmonary ROS,    Pulmonary exam normal        Cardiovascular Exercise Tolerance: Good negative cardio ROS Normal cardiovascular exam     Neuro/Psych negative neurological ROS  negative psych ROS   GI/Hepatic negative GI ROS, Neg liver ROS,   Endo/Other  negative endocrine ROS  Renal/GU negative Renal ROS     Musculoskeletal   Abdominal   Peds  Hematology negative hematology ROS (+)   Anesthesia Other Findings   Reproductive/Obstetrics                             Anesthesia Physical Anesthesia Plan  ASA: II  Anesthesia Plan: General   Post-op Pain Management:    Induction: Intravenous  PONV Risk Score and Plan: 2 and Propofol infusion, TIVA and Treatment may vary due to age or medical condition  Airway Management Planned: Nasal Cannula and Natural Airway  Additional Equipment: None  Intra-op Plan:   Post-operative Plan:   Informed Consent: I have reviewed the patients History and Physical, chart, labs and discussed the procedure including the risks, benefits and alternatives for the proposed anesthesia with the patient or authorized representative who has indicated his/her understanding and acceptance.       Plan Discussed with: CRNA  Anesthesia Plan Comments:         Anesthesia Quick Evaluation

## 2019-08-11 NOTE — Transfer of Care (Signed)
Immediate Anesthesia Transfer of Care Note  Patient: Roger Reeves  Procedure(s) Performed: COLONOSCOPY WITH BIOPSY (N/A Rectum) POLYPECTOMY (N/A Rectum)  Patient Location: PACU  Anesthesia Type: General  Level of Consciousness: awake, alert  and patient cooperative  Airway and Oxygen Therapy: Patient Spontanous Breathing and Patient connected to supplemental oxygen  Post-op Assessment: Post-op Vital signs reviewed, Patient's Cardiovascular Status Stable, Respiratory Function Stable, Patent Airway and No signs of Nausea or vomiting  Post-op Vital Signs: Reviewed and stable  Complications: No apparent anesthesia complications

## 2019-08-11 NOTE — Op Note (Signed)
[facility]astroenterology Patient Name: Roger Reeves Procedure Date: 08/11/2019 11:10 AM MRN: [id number] Account #: [id number] Date of Birth: [birth year] Admit Type: Outpatient Age: 49 [facility] Gender: Male Note Status: Finalized Procedure:             Colonoscopy Indications:           Screening for colorectal malignant neoplasm Providers:             Lucilla Lame MD, MD Referring MD:          Leonie Douglas. Doy Hutching, MD (Referring MD) Medicines:             Propofol per Anesthesia Complications:         No immediate complications. Procedure:             Pre-Anesthesia Assessment:                        - Prior to the procedure, a History and Physical was                         performed, and patient medications and allergies were                         reviewed. The patient's tolerance of previous                         anesthesia was also reviewed. The risks and benefits                         of the procedure and the sedation options and risks                         were discussed with the patient. All questions were                         answered, and informed consent was obtained. Prior                         Anticoagulants: The patient has taken no previous                         anticoagulant or antiplatelet agents. ASA Grade                         Assessment: II - A patient with mild systemic disease.                         After reviewing the risks and benefits, the patient                         was deemed in satisfactory condition to undergo the                         procedure.                        After obtaining informed consent, the colonoscope was  passed under direct vision. Throughout the procedure,                         the patient's blood pressure, pulse, and oxygen                         saturations were monitored continuously. The                         Colonoscope was introduced through the  anus and                         advanced to the the cecum, identified by appendiceal                         orifice and ileocecal valve. The colonoscopy was                         performed without difficulty. The patient tolerated                         the procedure well. The quality of the bowel                         preparation was excellent. Findings:      The perianal and digital rectal examinations were normal.      A 4 mm polyp was found in the ascending colon. The polyp was sessile.       The polyp was removed with a cold snare. Resection and retrieval were       complete.      A 3 mm polyp was found in the descending colon. The polyp was sessile.       The polyp was removed with a cold biopsy forceps. Resection and       retrieval were complete.      Multiple small-mouthed diverticula were found in the sigmoid colon.      Non-bleeding internal hemorrhoids were found during retroflexion. The       hemorrhoids were Grade I (internal hemorrhoids that do not prolapse). Impression:            - One 4 mm polyp in the ascending colon, removed with                         a cold snare. Resected and retrieved.                        - One 3 mm polyp in the descending colon, removed with                         a cold biopsy forceps. Resected and retrieved.                        - Diverticulosis in the sigmoid colon.                        - Non-bleeding internal hemorrhoids. Recommendation:        - Discharge patient to home.                        -  Resume previous diet.                        - Continue present medications.                        - Await pathology results.                        - Repeat colonoscopy in 5 years if polyp adenoma and                         10 years if hyperplastic Procedure Code(s):     --- Professional ---                        [phone], Colonoscopy, flexible; with removal of                         tumor(s), polyp(s), or other lesion(s) by  snare                         technique                        45380, 15, Colonoscopy, flexible; with biopsy, single                         or multiple Diagnosis Code(s):     --- Professional ---                        Z12.11, Encounter for screening for malignant neoplasm                         of colon                        K63.5, Polyp of colon CPT copyright 2019 American Medical Association. All rights reserved. The codes documented in this report are preliminary and upon coder review may  be revised to meet current compliance requirements. Lucilla Lame MD, MD 08/11/2019 11:32:54 AM This report has been signed electronically. Number of Addenda: 0 Note Initiated On: 08/11/2019 11:10 AM Scope Withdrawal Time: 0 hours 9 minutes 46 seconds  Total Procedure Duration: 0 hours 12 minutes 14 seconds  Estimated Blood Loss:  Estimated blood loss: none.      [facility]

## 2019-08-11 NOTE — Anesthesia Procedure Notes (Signed)
Date/Time: 08/11/2019 11:14 AM Performed by: Cameron Ali, CRNA Pre-anesthesia Checklist: Patient identified, Emergency Drugs available, Suction available, Timeout performed and Patient being monitored Patient Re-evaluated:Patient Re-evaluated prior to induction Oxygen Delivery Method: Nasal cannula Placement Confirmation: positive ETCO2

## 2019-08-11 NOTE — Anesthesia Postprocedure Evaluation (Signed)
Anesthesia Post Note  Patient: Roger Reeves  Procedure(s) Performed: COLONOSCOPY WITH BIOPSY (N/A Rectum) POLYPECTOMY (N/A Rectum)     Patient location during evaluation: PACU Anesthesia Type: General Level of consciousness: awake and alert Pain management: pain level controlled Vital Signs Assessment: post-procedure vital signs reviewed and stable Respiratory status: spontaneous breathing, nonlabored ventilation, respiratory function stable and patient connected to nasal cannula oxygen Cardiovascular status: blood pressure returned to baseline and stable Postop Assessment: no apparent nausea or vomiting Anesthetic complications: no    Adele Barthel Adanya Sosinski

## 2019-08-11 NOTE — H&P (Signed)
Lucilla Lame, MD [address] [place], [place] [address] Phone: [phone] Fax : [phone]  Primary Care Physician:  Idelle Crouch, MD Primary Gastroenterologist:  Dr. Allen Norris  Pre-Procedure History & Physical: HPI:  Roger Reeves is a 49 y.o. male is here for a screening colonoscopy.   Past Medical History:  Diagnosis Date  . Pectus excavatum    surgically repaired (age 65).  Wire removed (age 79)  . Plantar fasciitis    bilateral    Past Surgical History:  Procedure Laterality Date  . HERNIA REPAIR Bilateral 2013   inguinal  . PECTUS EXCAVATUM REPAIR     repair (age 79).  Wire removed (age 41)  . WISDOM TOOTH EXTRACTION      Prior to Admission medications   Medication Sig Start Date End Date Taking? Authorizing Provider  meloxicam (MOBIC) 15 MG tablet Take 15 mg by mouth daily.   Yes [provider]    Allergies as of 07/08/2019  . (Not on File)    History reviewed. No pertinent family history.  Social History   Socioeconomic History  . Marital status: Married    Spouse name: Not on file  . Number of children: Not on file  . Years of education: Not on file  . Highest education level: Not on file  Occupational History  . Not on file  Tobacco Use  . Smoking status: Never Smoker  . Smokeless tobacco: Never Used  Substance and Sexual Activity  . Alcohol use: Yes    Comment: 2 beers/month  . Drug use: Not on file  . Sexual activity: Not on file  Other Topics Concern  . Not on file  Social History Narrative  . Not on file   Social Determinants of Health   Financial Resource Strain:   . Difficulty of Paying Living Expenses:   Food Insecurity:   . Worried About Charity fundraiser in the Last Year:   . Arboriculturist in the Last Year:   Transportation Needs:   . Film/video editor (Medical):   Marland Kitchen Lack of Transportation (Non-Medical):   Physical Activity:   . Days of Exercise per Week:   . Minutes of Exercise per  Session:   Stress:   . Feeling of Stress :   Social Connections:   . Frequency of Communication with Friends and Family:   . Frequency of Social Gatherings with Friends and Family:   . Attends Religious Services:   . Active Member of Clubs or Organizations:   . Attends Archivist Meetings:   Marland Kitchen Marital Status:   Intimate Partner Violence:   . Fear of Current or Ex-Partner:   . Emotionally Abused:   Marland Kitchen Physically Abused:   . Sexually Abused:     Review of Systems: See HPI, otherwise negative ROS  Physical Exam: BP (!) (P) 143/99   Pulse (P) 83   Temp (P) 97.8 F (36.6 C) (Temporal)   Resp (P) 16   Ht (P) 5\' 8"  (1.727 m)   Wt (P) 99.8 kg   SpO2 (P) 98%   BMI (P) 33.45 kg/m  General:   Alert,  pleasant and cooperative in NAD Head:  Normocephalic and atraumatic. Neck:  Supple; no masses or thyromegaly. Lungs:  Clear throughout to auscultation.    Heart:  Regular rate and rhythm. Abdomen:  Soft, nontender and nondistended. Normal bowel sounds, without guarding, and without rebound.   Neurologic:  Alert and  oriented  x4;  grossly normal neurologically.  Impression/Plan: Roger Reeves is now here to undergo a screening colonoscopy.  Risks, benefits, and alternatives regarding colonoscopy have been reviewed with the patient.  Questions have been answered.  All parties agreeable.

## 2019-08-12 ENCOUNTER — Encounter: Payer: Self-pay | Admitting: *Deleted

## 2019-08-12 ENCOUNTER — Encounter: Payer: Self-pay | Admitting: Gastroenterology

## 2019-08-12 LAB — SURGICAL PATHOLOGY

## 2020-01-12 ENCOUNTER — Encounter: Payer: Self-pay | Admitting: Emergency Medicine

## 2020-01-12 ENCOUNTER — Ambulatory Visit
Admission: EM | Admit: 2020-01-12 | Discharge: 2020-01-12 | Disposition: A | Discharge status: Home / Self Care | Payer: BC Managed Care – PPO

## 2020-01-12 ENCOUNTER — Other Ambulatory Visit: Payer: Self-pay

## 2020-01-12 ENCOUNTER — Emergency Department: Payer: 59

## 2020-01-12 DIAGNOSIS — G459 Transient cerebral ischemic attack, unspecified: Secondary | ICD-10-CM | POA: Diagnosis not present

## 2020-01-12 DIAGNOSIS — I639 Cerebral infarction, unspecified: Secondary | ICD-10-CM | POA: Diagnosis not present

## 2020-01-12 DIAGNOSIS — R200 Anesthesia of skin: Secondary | ICD-10-CM

## 2020-01-12 DIAGNOSIS — I10 Essential (primary) hypertension: Secondary | ICD-10-CM | POA: Diagnosis not present

## 2020-01-12 DIAGNOSIS — Z20822 Contact with and (suspected) exposure to covid-19: Secondary | ICD-10-CM | POA: Insufficient documentation

## 2020-01-12 HISTORY — DX: Essential (primary) hypertension: I10

## 2020-01-12 LAB — CBC
HCT: 46 % (ref 39.0–52.0)
Hemoglobin: 15.6 g/dL (ref 13.0–17.0)
MCH: 30.1 pg (ref 26.0–34.0)
MCHC: 33.9 g/dL (ref 30.0–36.0)
MCV: 88.6 fL (ref 80.0–100.0)
Platelets: 222 10*3/uL (ref 150–400)
RBC: 5.19 MIL/uL (ref 4.22–5.81)
RDW: 12.8 % (ref 11.5–15.5)
WBC: 7.3 10*3/uL (ref 4.0–10.5)
nRBC: 0 % (ref 0.0–0.2)

## 2020-01-12 LAB — COMPREHENSIVE METABOLIC PANEL
ALT: 18 U/L (ref 0–44)
AST: 20 U/L (ref 15–41)
Albumin: 4.7 g/dL (ref 3.5–5.0)
Alkaline Phosphatase: 62 U/L (ref 38–126)
Anion gap: 11 (ref 5–15)
BUN: 21 mg/dL — ABNORMAL HIGH (ref 6–20)
CO2: 28 mmol/L (ref 22–32)
Calcium: 9.6 mg/dL (ref 8.9–10.3)
Chloride: 100 mmol/L (ref 98–111)
Creatinine, Ser: 1.02 mg/dL (ref 0.61–1.24)
GFR calc Af Amer: >60 mL/min (ref >60)
GFR calc non Af Amer: >60 mL/min (ref >60)
Glucose, Bld: 96 mg/dL (ref 70–99)
Potassium: 4.1 mmol/L (ref 3.5–5.1)
Sodium: 139 mmol/L (ref 135–145)
Total Bilirubin: 0.8 mg/dL (ref 0.3–1.2)
Total Protein: 7.9 g/dL (ref 6.5–8.1)

## 2020-01-12 LAB — DIFFERENTIAL
Abs Immature Granulocytes: 0.01 10*3/uL (ref 0.00–0.07)
Basophils Absolute: 0 10*3/uL (ref 0.0–0.1)
Basophils Relative: 1 %
Eosinophils Absolute: 0.5 10*3/uL (ref 0.0–0.5)
Eosinophils Relative: 7 %
Immature Granulocytes: 0 %
Lymphocytes Relative: 29 %
Lymphs Abs: 2.1 10*3/uL (ref 0.7–4.0)
Monocytes Absolute: 0.5 10*3/uL (ref 0.1–1.0)
Monocytes Relative: 7 %
Neutro Abs: 4.1 10*3/uL (ref 1.7–7.7)
Neutrophils Relative %: 56 %

## 2020-01-12 LAB — TROPONIN I (HIGH SENSITIVITY): Troponin I (High Sensitivity): 4 ng/L (ref <18)

## 2020-01-12 LAB — APTT: aPTT: 28 seconds (ref 24–36)

## 2020-01-12 LAB — PROTIME-INR
INR: 1 (ref 0.8–1.2)
Prothrombin Time: 12.7 seconds (ref 11.4–15.2)

## 2020-01-12 NOTE — ED Provider Notes (Signed)
[facility]    CSN: [id number] Arrival date & time: 01/12/20  1753      History   Chief Complaint Chief Complaint  Patient presents with  . Numbness    HPI Roger Reeves is a 49 y.o. male with a history of hypertension comes to the urgent care with complaints of right-sided facial numbness which started this morning.  Patient went to bed feeling normal last night.  When he woke up this morning he experienced numbness in the right side of the face.  Numbness and tingling has now resolved over the course of the day.  He also had right upper extremity numbness.  No weakness.  No double vision or blurry vision.  No neck pain.  No rash on the right side of the face.  He denies any difficulty finding his words.  No confusion.  No difficulty swallowing.   HPI  Past Medical History:  Diagnosis Date  . Hypertension   . Pectus excavatum    surgically repaired (age 51).  Wire removed (age 61)  . Plantar fasciitis    bilateral    Patient Active Problem List   Diagnosis Date Noted  . Special screening for malignant neoplasms, colon   . Polyp of descending colon   . Polyp of ascending colon     Past Surgical History:  Procedure Laterality Date  . COLONOSCOPY WITH PROPOFOL N/A 08/11/2019   Procedure: COLONOSCOPY WITH BIOPSY;  Surgeon: Lucilla Lame, MD;  Location: [place];  Service: Endoscopy;  Laterality: N/A;  priority 4  . HERNIA REPAIR Bilateral 2013   inguinal  . PECTUS EXCAVATUM REPAIR     repair (age 62).  Wire removed (age 69)  . POLYPECTOMY N/A 08/11/2019   Procedure: POLYPECTOMY;  Surgeon: Lucilla Lame, MD;  Location: [place];  Service: Endoscopy;  Laterality: N/A;  . WISDOM TOOTH EXTRACTION         Home Medications    Prior to Admission medications   Medication Sig Start Date End Date Taking? Authorizing Provider  amLODipine (NORVASC) 5 MG tablet Take by mouth. 10/13/19 10/12/20 Yes [provider]  meloxicam (MOBIC) 15 MG  tablet Take 15 mg by mouth daily.    [provider]    Family History Family History  Problem Relation Age of Onset  . Heart Problems Father     Social History Social History   Tobacco Use  . Smoking status: Never Smoker  . Smokeless tobacco: Never Used  Vaping Use  . Vaping Use: Never used  Substance Use Topics  . Alcohol use: Yes    Comment: 2 beers/month  . Drug use: Never     Allergies   Patient has no known allergies.   Review of Systems Review of Systems  Constitutional: Negative.   Musculoskeletal: Negative.   Neurological: Positive for numbness. Negative for speech difficulty and weakness.  Psychiatric/Behavioral: Negative for decreased concentration.     Physical Exam Triage Vital Signs ED Triage Vitals  Enc Vitals Group     BP 01/12/20 1759 (!) 158/92     Pulse Rate 01/12/20 1759 74     Resp 01/12/20 1759 18     Temp 01/12/20 1759 98.1 F (36.7 C)     Temp Source 01/12/20 1759 Oral     SpO2 01/12/20 1759 98 %     Weight 01/12/20 1801 215 lb (97.5 kg)     Height 01/12/20 1801 5\' 9"  (1.753 m)     Head Circumference --  Peak Flow --      Pain Score 01/12/20 1801 0     Pain Loc --      Pain Edu? --      Excl. in [place]? --    No data found.  Updated Vital Signs BP (!) 158/92 (BP Location: Left Arm)   Pulse 74   Temp 98.1 F (36.7 C) (Oral)   Resp 18   Ht 5\' 9"  (1.753 m)   Wt 97.5 kg   SpO2 98%   BMI 31.75 kg/m   Visual Acuity Right Eye Distance:   Left Eye Distance:   Bilateral Distance:    Right Eye Near:   Left Eye Near:    Bilateral Near:     Physical Exam Vitals and nursing note reviewed.  Constitutional:      General: He is not in acute distress.    Appearance: He is not ill-appearing.  Neurological:     Mental Status: He is alert.     Comments: Right-sided facial numbness with sparing of the forehead.  The cranial nerves are intact bilaterally.  Power is 5/5 in all extremities.  Slight numbness over the right  upper extremity.  Deep tendon reflexes are 2+ in all areas.      UC Treatments / Results  Labs (all labs ordered are listed, but only abnormal results are displayed) Labs Reviewed - No data to display  EKG   Radiology No results found.  Procedures Procedures (including critical care time)  Medications Ordered in UC Medications - No data to display  Initial Impression / Assessment and Plan / UC Course  I have reviewed the triage vital signs and the nursing notes.  Pertinent labs & imaging results that were available during my care of the patient were reviewed by me and considered in my medical decision making (see chart for details).     1.  Persistent right facial numbness: Given the persistent nature of the numbness patient is advised to go to the emergency department for further evaluation for possible CVA.  Patient verbalized understanding. EKG shows normal sinus rhythm with left atrial enlargement. Final Clinical Impressions(s) / UC Diagnoses   Final diagnoses:  Right facial numbness   Discharge Instructions   None    ED Prescriptions    None     PDMP not reviewed this encounter.   Chase Picket, MD 01/12/20 [phone]

## 2020-01-12 NOTE — ED Triage Notes (Addendum)
Pt states he was fine going to bed last night and when he woke up this morning, his right face was tingly and his right arm was tingly.  Feeling has been going on all day and has been consistent. No dizziness or ataxia.  No pain. PERRL, no facial asymmetry, no palmar drift, hand grasps strong and equal.

## 2020-01-12 NOTE — ED Triage Notes (Addendum)
Pt reports he woke this AM with numbness to the right side or face and right arm, continued throughout day. Denies dizziness. No drift noted, bilateral hand grip strong, no facial droop noted. Pt seen at [place] and directed to come straight to ED.

## 2020-01-13 ENCOUNTER — Observation Stay
Admission: EM | Admit: 2020-01-13 | Discharge: 2020-01-14 | Disposition: A | Discharge status: Home / Self Care | Payer: 59 | Attending: Internal Medicine | Admitting: Internal Medicine

## 2020-01-13 ENCOUNTER — Observation Stay
Admit: 2020-01-13 | Discharge: 2020-01-13 | Disposition: A | Discharge status: Home / Self Care | Payer: 59 | Attending: Internal Medicine | Admitting: Internal Medicine

## 2020-01-13 ENCOUNTER — Emergency Department: Payer: 59

## 2020-01-13 ENCOUNTER — Observation Stay: Payer: 59

## 2020-01-13 ENCOUNTER — Other Ambulatory Visit: Payer: Self-pay

## 2020-01-13 DIAGNOSIS — G9389 Other specified disorders of brain: Secondary | ICD-10-CM | POA: Diagnosis not present

## 2020-01-13 DIAGNOSIS — Z20822 Contact with and (suspected) exposure to covid-19: Secondary | ICD-10-CM | POA: Diagnosis not present

## 2020-01-13 DIAGNOSIS — I6389 Other cerebral infarction: Secondary | ICD-10-CM | POA: Diagnosis not present

## 2020-01-13 DIAGNOSIS — I10 Essential (primary) hypertension: Secondary | ICD-10-CM | POA: Diagnosis not present

## 2020-01-13 DIAGNOSIS — G459 Transient cerebral ischemic attack, unspecified: Secondary | ICD-10-CM

## 2020-01-13 DIAGNOSIS — I639 Cerebral infarction, unspecified: Secondary | ICD-10-CM | POA: Diagnosis not present

## 2020-01-13 DIAGNOSIS — R200 Anesthesia of skin: Secondary | ICD-10-CM | POA: Diagnosis not present

## 2020-01-13 LAB — SARS CORONAVIRUS 2 BY RT PCR (HOSPITAL ORDER, PERFORMED IN ~~LOC~~ HOSPITAL LAB): SARS Coronavirus 2: NEGATIVE

## 2020-01-13 MED ORDER — ASPIRIN 81 MG PO CHEW
324.0000 mg | CHEWABLE_TABLET | Freq: Once | ORAL | Status: AC
  Administered 2020-01-13: 324 mg via ORAL
  Filled 2020-01-13: qty 4

## 2020-01-13 MED ORDER — ATORVASTATIN CALCIUM 20 MG PO TABS
40.0000 mg | ORAL_TABLET | Freq: Every day | ORAL | Status: DC
  Administered 2020-01-13 – 2020-01-14 (×2): 40 mg via ORAL
  Filled 2020-01-13 (×2): qty 2

## 2020-01-13 MED ORDER — SENNOSIDES-DOCUSATE SODIUM 8.6-50 MG PO TABS: 1.0000 | ORAL_TABLET | Freq: Every evening | ORAL | Status: DC | PRN

## 2020-01-13 MED ORDER — ONDANSETRON HCL 4 MG/2ML IJ SOLN: 4.0000 mg | Freq: Three times a day (TID) | INTRAMUSCULAR | Status: DC | PRN

## 2020-01-13 MED ORDER — ENOXAPARIN SODIUM 40 MG/0.4ML ~~LOC~~ SOLN
40.0000 mg | SUBCUTANEOUS | Status: DC
  Administered 2020-01-13: 22:00:00 40 mg via SUBCUTANEOUS
  Filled 2020-01-13: qty 0.4

## 2020-01-13 MED ORDER — ASPIRIN 81 MG PO CHEW
81.0000 mg | CHEWABLE_TABLET | Freq: Every day | ORAL | Status: DC
  Administered 2020-01-14: 12:00:00 81 mg via ORAL
  Filled 2020-01-13: qty 1

## 2020-01-13 MED ORDER — ACETAMINOPHEN 325 MG PO TABS: 650.0000 mg | ORAL_TABLET | Freq: Four times a day (QID) | ORAL | Status: DC | PRN

## 2020-01-13 MED ORDER — STROKE: EARLY STAGES OF RECOVERY BOOK
Freq: Once | Status: AC
  Administered 2020-01-13: 1

## 2020-01-13 MED ORDER — HYDRALAZINE HCL 20 MG/ML IJ SOLN: 5.0000 mg | INTRAMUSCULAR | Status: DC | PRN

## 2020-01-13 NOTE — ED Provider Notes (Signed)
[facility] Emergency Department Provider Note  ____________________________________________  Time seen: Approximately 6:25 AM  I have reviewed the triage vital signs and the nursing notes.   HISTORY  Chief Complaint Numbness   HPI Roger Reeves is a 49 y.o. male with history of hypertension and elevated BMI who presents for evaluation of right-sided numbness.  Patient reports that he woke up yesterday with right facial numbness and right arm numbness.  He reports that the arm numbness has resolved however the face is still present although significantly improved.  He denies any slurred speech, facial droop, gait instability, diplopia, dysarthria, dysphagia, any weakness.  No prior history of stroke but does have family history of such.  Patient is not a smoker.  Denies any head trauma.  Is not on any blood thinners.   Past Medical History:  Diagnosis Date  . Hypertension   . Pectus excavatum    surgically repaired (age 55).  Wire removed (age 55)  . Plantar fasciitis    bilateral    Patient Active Problem List   Diagnosis Date Noted  . Special screening for malignant neoplasms, colon   . Polyp of descending colon   . Polyp of ascending colon     Past Surgical History:  Procedure Laterality Date  . COLONOSCOPY WITH PROPOFOL N/A 08/11/2019   Procedure: COLONOSCOPY WITH BIOPSY;  Surgeon: Lucilla Lame, MD;  Location: [place];  Service: Endoscopy;  Laterality: N/A;  priority 4  . HERNIA REPAIR Bilateral 2013   inguinal  . PECTUS EXCAVATUM REPAIR     repair (age 66).  Wire removed (age 23)  . POLYPECTOMY N/A 08/11/2019   Procedure: POLYPECTOMY;  Surgeon: Lucilla Lame, MD;  Location: [place];  Service: Endoscopy;  Laterality: N/A;  . WISDOM TOOTH EXTRACTION      Prior to Admission medications   Medication Sig Start Date End Date Taking? Authorizing Provider  amLODipine (NORVASC) 5 MG tablet Take by mouth. 10/13/19 10/12/20  [provider]  meloxicam (MOBIC) 15 MG tablet Take 15 mg by mouth daily.    [provider]    Allergies Patient has no known allergies.  Family History  Problem Relation Age of Onset  . Heart Problems Father     Social History Social History   Tobacco Use  . Smoking status: Never Smoker  . Smokeless tobacco: Never Used  Vaping Use  . Vaping Use: Never used  Substance Use Topics  . Alcohol use: Yes    Comment: 2 beers/month  . Drug use: Never    Review of Systems  Constitutional: Negative for fever. Eyes: Negative for visual changes. ENT: Negative for sore throat. Neck: No neck pain  Cardiovascular: Negative for chest pain. Respiratory: Negative for shortness of breath. Gastrointestinal: Negative for abdominal pain, vomiting or diarrhea. Genitourinary: Negative for dysuria. Musculoskeletal: Negative for back pain. Skin: Negative for rash. Neurological: Negative for headaches. + R facial and RUE numbness Psych: No SI or HI  ____________________________________________   PHYSICAL EXAM:  VITAL SIGNS: ED Triage Vitals  Enc Vitals Group     BP 01/12/20 1925 (!) 151/84     Pulse Rate 01/12/20 1925 70     Resp 01/12/20 1925 17     Temp 01/12/20 1925 99 F (37.2 C)     Temp Source 01/12/20 1925 Oral     SpO2 01/12/20 1925 97 %     Weight 01/12/20 1926 215 lb (97.5 kg)     Height  01/12/20 1926 5\' 9"  (1.753 m)     Head Circumference --      Peak Flow --      Pain Score --      Pain Loc --      Pain Edu? --      Excl. in [place]? --     Constitutional: Alert and oriented. Well appearing and in no apparent distress. HEENT:      Head: Normocephalic and atraumatic.         Eyes: Conjunctivae are normal. Sclera is non-icteric.       Mouth/Throat: Mucous membranes are moist.       Neck: Supple with no signs of meningismus. Cardiovascular: Regular rate and rhythm. No murmurs, gallops, or rubs. 2+ symmetrical distal pulses are present in all extremities. No  JVD.  No bruits Respiratory: Normal respiratory effort. Lungs are clear to auscultation bilaterally.  Gastrointestinal: Soft, non tender. Musculoskeletal:  No edema, cyanosis, or erythema of extremities. Neurologic: Normal speech and language. Face is symmetric. EOMI, PERRL, intact strength and sensation x4.  Patient does report slight decrease sensation to touch on the right lower face, intact visual fields, no pronator drift, no dysmetria, normal gait Skin: Skin is warm, dry and intact. No rash noted. Psychiatric: Mood and affect are normal. Speech and behavior are normal.  ____________________________________________   LABS (all labs ordered are listed, but only abnormal results are displayed)  Labs Reviewed  COMPREHENSIVE METABOLIC PANEL - Abnormal; Notable for the following components:      Result Value   BUN 21 (*)    All other components within normal limits  PROTIME-INR  APTT  CBC  DIFFERENTIAL  CBG MONITORING, ED  TROPONIN I (HIGH SENSITIVITY)  TROPONIN I (HIGH SENSITIVITY)   ____________________________________________  EKG  ED ECG REPORT I, Rudene Re, the attending physician, personally viewed and interpreted this ECG.  Normal sinus rhythm, rate of 71, normal intervals, normal axis, anterior Q waves and no ST elevation.  ____________________________________________  RADIOLOGY  I have personally reviewed the images performed during this visit and I agree with the Radiologist's read.   Interpretation by Radiologist:  CT HEAD WO CONTRAST  Result Date: 01/12/2020 CLINICAL DATA:  Right-sided facial numbness, right upper extremity numbness EXAM: CT HEAD WITHOUT CONTRAST TECHNIQUE: Contiguous axial images were obtained from the base of the skull through the vertex without intravenous contrast. COMPARISON:  None. FINDINGS: Brain: No acute infarct or hemorrhage. Lateral ventricles and midline structures are unremarkable. No acute extra-axial fluid collections.  No mass effect. Vascular: No hyperdense vessel or unexpected calcification. Skull: Normal. Negative for fracture or focal lesion. Sinuses/Orbits: No acute finding. Other: None. IMPRESSION: 1. No acute intracranial process. Electronically Signed   By: Randa Ngo M.D.   On: 01/12/2020 19:46     ____________________________________________   PROCEDURES  Procedure(s) performed:yes .1-3 Lead EKG Interpretation Performed by: Rudene Re, MD Authorized by: Rudene Re, MD     Interpretation: normal     ECG rate assessment: normal     Rhythm: sinus rhythm     Ectopy: none     Critical Care performed:  None ____________________________________________   INITIAL IMPRESSION / ASSESSMENT AND PLAN / ED COURSE   49 y.o. male with history of hypertension and elevated BMI who presents for evaluation of right-sided numbness.  Patient describing right facial and right upper extremity numbness.  The numbness in the arm has resolved but he still complaining of slight numbness on the right lower face.  He is  otherwise completely neurologically intact with an NIH stroke scale of 0.  EKG with no signs of dysrhythmias.  Head CT visualized with no evidence of stroke, confirmed by radiology.  We will get an MRI.  Differential including TIA versus stroke.  Patient has intact and strong pulses in all 4 extremities with no chest pain, no pain radiating to the back or the neck therefore no clinical suspicion of a dissection at this time.  Work with no acute abnormalities, normal electrolytes and sugar, no white count, no anemia.  Will contact patient on to telemetry to eval for any signs of dysrhythmias, get an MRI, give a full dose aspirin, and reassess for disposition.  Per ABCD2 score patient low risk.  Old medical records reviewed.  _________________________ 6:59 AM on 01/13/2020 -----------------------------------------  MRI pending.  Care transferred to Dr. Creig Hines at [facility].       _____________________________________________ Please note:  Patient was evaluated in Emergency Department today for the symptoms described in the history of present illness. Patient was evaluated in the context of the global COVID-19 pandemic, which necessitated consideration that the patient might be at risk for infection with the SARS-CoV-2 virus that causes COVID-19. Institutional protocols and algorithms that pertain to the evaluation of patients at risk for COVID-19 are in a state of rapid change based on information released by regulatory bodies including the CDC and federal and state organizations. These policies and algorithms were followed during the patient's care in the ED.  Some ED evaluations and interventions may be delayed as a result of limited staffing during the pandemic.    Controlled Substance Database was reviewed by me. ____________________________________________   FINAL CLINICAL IMPRESSION(S) / ED DIAGNOSES   Final diagnoses:  TIA (transient ischemic attack)      NEW MEDICATIONS STARTED DURING THIS VISIT:  ED Discharge Orders    None       Note:  This document was prepared using Dragon voice recognition software and may include unintentional dictation errors.    Alfred Levins, [place], MD 01/13/20 0700

## 2020-01-13 NOTE — Plan of Care (Signed)
  Problem: Education: Goal: Knowledge of disease or condition will improve Outcome: Progressing Goal: Knowledge of secondary prevention will improve Outcome: Progressing Goal: Knowledge of patient specific risk factors addressed and post discharge goals established will improve Outcome: Progressing   

## 2020-01-13 NOTE — ED Notes (Signed)
Report from [place], [place]. Pt waiting for MRI.

## 2020-01-13 NOTE — H&P (Signed)
History and Physical    Roger Reeves:[id number] DOB: 1970-12-12 DOA: 01/13/2020  Referring MD/NP/PA:   PCP: Idelle Crouch, MD   Patient coming from:  The patient is coming from home.  At baseline, pt is independent for most of ADL.        Chief Complaint: Right-sided numbness  HPI: Roger Reeves is a 49 y.o. male with medical history significant of hypertension, pectus excavating, who presents with right-sided weakness.  Patient states that he woke up yesterday with right facial numbness and right arm numbness. His arm numbness has resolved, but still has right facial numbness.  No weakness in extremities.  No facial droop or slurred speech.  No vision loss or hearing loss.  Patient denies any chest pain, shortness breath, cough.  No fever or chills.  No nausea vomiting, diarrhea, abdominal pain, symptoms of UTI.  ED Course: pt was found to have WBC 7.3, INR 1.0, PTT 28, pending COVID-19 PCR, electrolytes renal function okay, temperature normal, blood pressure 122/89, heart rate 66, RR 10, oxygen saturation 96% on room air.  CT of head is negative for acute intracranial abnormalities.  MRI of her brain showed a small stroke in her left midbrain.  Patient is placed on MedSurg bed of observation.  Neurology, Dr. Irish Elders is consulted.  Review of Systems:   General: no fevers, chills, no body weight gain, has fatigue HEENT: no blurry vision, hearing changes or sore throat Respiratory: no dyspnea, coughing, wheezing CV: no chest pain, no palpitations GI: no nausea, vomiting, abdominal pain, diarrhea, constipation GU: no dysuria, burning on urination, increased urinary frequency, hematuria  Ext: no leg edema Neuro: no vision change or hearing loss. Has right-sided numbness Skin: no rash, no skin tear. MSK: No muscle spasm, no deformity, no limitation of range of movement in spin Heme: No easy bruising.  Travel history: No recent long distant travel.  Allergy: No Known  Allergies  Past Medical History:  Diagnosis Date  . Hypertension   . Pectus excavatum    surgically repaired (age 60).  Wire removed (age 30)  . Plantar fasciitis    bilateral    Past Surgical History:  Procedure Laterality Date  . COLONOSCOPY WITH PROPOFOL N/A 08/11/2019   Procedure: COLONOSCOPY WITH BIOPSY;  Surgeon: Lucilla Lame, MD;  Location: [place];  Service: Endoscopy;  Laterality: N/A;  priority 4  . HERNIA REPAIR Bilateral 2013   inguinal  . PECTUS EXCAVATUM REPAIR     repair (age 37).  Wire removed (age 37)  . POLYPECTOMY N/A 08/11/2019   Procedure: POLYPECTOMY;  Surgeon: Lucilla Lame, MD;  Location: [place];  Service: Endoscopy;  Laterality: N/A;  . WISDOM TOOTH EXTRACTION      Social History:  reports that he has never smoked. He has never used smokeless tobacco. He reports current alcohol use. He reports that he does not use drugs.  Family History:  Family History  Problem Relation Age of Onset  . Heart Problems Father      Prior to Admission medications   Medication Sig Start Date End Date Taking? Authorizing Provider  amLODipine (NORVASC) 5 MG tablet Take by mouth. 10/13/19 10/12/20  [provider]  meloxicam (MOBIC) 15 MG tablet Take 15 mg by mouth daily.    [provider]    Physical Exam: Vitals:   01/13/20 0145 01/13/20 0551 01/13/20 0649 01/13/20 0900  BP: (!) 166/86 (!) 148/96 (!) 147/85 122/89  Pulse: 72 74 73 66  Resp: 18 20 15 10   Temp: 98.9 F (37.2 C) 98.1 F (36.7 C)    TempSrc: Oral Oral    SpO2: 96% 96% 97% 96%  Weight:      Height:       General: Not in acute distress HEENT:       Eyes: PERRL, EOMI, no scleral icterus.       ENT: No discharge from the ears and nose, no pharynx injection, no tonsillar enlargement.        Neck: No JVD, no bruit, no mass felt. Heme: No neck lymph node enlargement. Cardiac: S1/S2, RRR, No murmurs, No gallops or rubs. Respiratory:  No rales, wheezing, rhonchi or  rubs. GI: Soft, nondistended, nontender, no rebound pain, no organomegaly, BS present. GU: No hematuria Ext: No pitting leg edema bilaterally. 2+DP/PT pulse bilaterally. Musculoskeletal: No joint deformities, No joint redness or warmth, no limitation of ROM in spin. Skin: No rashes.  Neuro: Alert, oriented X3, cranial nerves II-XII grossly intact, moves all extremities normally. Muscle strength 5/5 in all extremities, sensation to light touch intact. Psych: Patient is not psychotic, no suicidal or hemocidal ideation.  Labs on Admission: I have personally reviewed following labs and imaging studies  CBC: Recent Labs  Lab 01/12/20 1931  WBC 7.3  NEUTROABS 4.1  HGB 15.6  HCT 46.0  MCV 88.6  PLT 700   Basic Metabolic Panel: Recent Labs  Lab 01/12/20 1931  NA 139  K 4.1  CL 100  CO2 28  GLUCOSE 96  BUN 21*  CREATININE 1.02  CALCIUM 9.6   GFR: Estimated Creatinine Clearance: 99.8 mL/min (by C-G formula based on SCr of 1.02 mg/dL). Liver Function Tests: Recent Labs  Lab 01/12/20 1931  AST 20  ALT 18  ALKPHOS 62  BILITOT 0.8  PROT 7.9  ALBUMIN 4.7   No results for input(s): LIPASE, AMYLASE in the last 168 hours. No results for input(s): AMMONIA in the last 168 hours. Coagulation Profile: Recent Labs  Lab 01/12/20 1931  INR 1.0   Cardiac Enzymes: No results for input(s): CKTOTAL, CKMB, CKMBINDEX, TROPONINI in the last 168 hours. BNP (last 3 results) No results for input(s): PROBNP in the last 8760 hours. HbA1C: No results for input(s): HGBA1C in the last 72 hours. CBG: No results for input(s): GLUCAP in the last 168 hours. Lipid Profile: No results for input(s): CHOL, HDL, LDLCALC, TRIG, CHOLHDL, LDLDIRECT in the last 72 hours. Thyroid Function Tests: No results for input(s): TSH, T4TOTAL, FREET4, T3FREE, THYROIDAB in the last 72 hours. Anemia Panel: No results for input(s): VITAMINB12, FOLATE, FERRITIN, TIBC, IRON, RETICCTPCT in the last 72 hours. Urine  analysis: No results found for: COLORURINE, APPEARANCEUR, LABSPEC, PHURINE, GLUCOSEU, HGBUR, BILIRUBINUR, KETONESUR, PROTEINUR, UROBILINOGEN, NITRITE, LEUKOCYTESUR Sepsis Labs: @LABRCNTIP (procalcitonin:4,lacticidven:4) )No results found for this or any previous visit (from the past 240 hour(s)).   Radiological Exams on Admission: CT HEAD WO CONTRAST  Result Date: 01/12/2020 CLINICAL DATA:  Right-sided facial numbness, right upper extremity numbness EXAM: CT HEAD WITHOUT CONTRAST TECHNIQUE: Contiguous axial images were obtained from the base of the skull through the vertex without intravenous contrast. COMPARISON:  None. FINDINGS: Brain: No acute infarct or hemorrhage. Lateral ventricles and midline structures are unremarkable. No acute extra-axial fluid collections. No mass effect. Vascular: No hyperdense vessel or unexpected calcification. Skull: Normal. Negative for fracture or focal lesion. Sinuses/Orbits: No acute finding. Other: None. IMPRESSION: 1. No acute intracranial process. Electronically Signed   By: Randa Ngo M.D.   On: 01/12/2020  19:46   MR BRAIN WO CONTRAST  Result Date: 01/13/2020 CLINICAL DATA:  Right-sided numbness EXAM: MRI HEAD WITHOUT CONTRAST TECHNIQUE: Multiplanar, multiecho pulse sequences of the brain and surrounding structures were obtained without intravenous contrast. COMPARISON:  None. FINDINGS: Brain: There is a thin 1.5 cm area mildly reduced diffusion at the left aspect of the midbrain including some involvement of the cerebral peduncle. There is no evidence of intracranial hemorrhage. There is no intracranial mass, mass effect, or edema. There is no hydrocephalus or extra-axial fluid collection. Ventricles and sulci are normal in size and configuration. Vascular: Major vessel flow voids at the skull base are preserved. Skull and upper cervical spine: Normal marrow signal is preserved. Sinuses/Orbits: Paranasal sinuses are aerated. Orbits are unremarkable. Other:  Sella is unremarkable.  Mastoid air cells are clear. IMPRESSION: Small acute infarct of the left mid brain. Otherwise unremarkable study. Electronically Signed   By: Macy Mis M.D.   On: 01/13/2020 10:02     EKG: Independently reviewed.  Sinus rhythm, QTC 425, low voltage, poor R wave progression  Assessment/Plan Principal Problem:   Stroke [facility]) Active Problems:   Hypertension   Stroke [facility]):  MRI of her brain showed a small stroke in her left midbrain.  Dr. Irish Elders of neurology is consulted, who recommended to complete stroke work-up.  -Placed on MedSurg bed for observation - Highly appreciated neurologist's consultation,will follow up recommendations as follows: - will hold oral Bp meds to allow permissive HTN in the setting of acute stroke  - Check carotid dopplers  - ASA and lipitor - fasting lipid panel and HbA1c  - 2D transthoracic echocardiography  - swallowing screen. If fails, will get SLP - Check UDS  - PT/OT consult  Hypertension -as needed hydralazine IV for SBP> 221 or dBP> 120 -Hold amlodipine    DVT ppx: SQ Lovenox Code Status: Full code Family Communication: not done, no family member is at bed side.   Disposition Plan:  Anticipate discharge back to previous environment Consults called: Dr. Irish Elders of neurology Admission status: Med-surg bed for obs         Status is: Observation  The patient remains OBS appropriate and will d/c before 2 midnights.  Dispo: The patient is from: Home              Anticipated d/c is to: Home              Anticipated d/c date is: 1 day              Patient currently is not medically stable to d/c.            Date of Service 01/13/2020    Ivor Costa [facility]   If 7PM-7AM, please contact night-coverage [email or web address] 01/13/2020, 11:33 AM

## 2020-01-13 NOTE — Consult Note (Signed)
Reason for Consult: R sided numbness   CC:  R sided numbness    HPI: Roger Reeves is an 49 y.o. male  with history of hypertension and elevated BMI who presents for evaluation of right-sided numbness.  Patient reports that he woke up yesterday with right facial numbness and right arm numbness.  He reports that the arm numbness has resolved however the face is still present although significantly improved.  He denies any slurred speech, facial droop, gait instability, diplopia, dysarthria, dysphagia, any weakness.  No prior history of stroke but does have family history of such. No anti platelet use prior to admission   Past Medical History:  Diagnosis Date  . Hypertension   . Pectus excavatum    surgically repaired (age 70).  Wire removed (age 65)  . Plantar fasciitis    bilateral    Past Surgical History:  Procedure Laterality Date  . COLONOSCOPY WITH PROPOFOL N/A 08/11/2019   Procedure: COLONOSCOPY WITH BIOPSY;  Surgeon: Lucilla Lame, MD;  Location: [place];  Service: Endoscopy;  Laterality: N/A;  priority 4  . HERNIA REPAIR Bilateral 2013   inguinal  . PECTUS EXCAVATUM REPAIR     repair (age 76).  Wire removed (age 51)  . POLYPECTOMY N/A 08/11/2019   Procedure: POLYPECTOMY;  Surgeon: Lucilla Lame, MD;  Location: [place];  Service: Endoscopy;  Laterality: N/A;  . WISDOM TOOTH EXTRACTION      Family History  Problem Relation Age of Onset  . Heart Problems Father     Social History:  reports that he has never smoked. He has never used smokeless tobacco. He reports current alcohol use. He reports that he does not use drugs.  No Known Allergies  Medications: I have reviewed the patient's current medications.  ROS: History obtained from the patient  General ROS: negative for - chills, fatigue, fever, night sweats, weight gain or weight loss Psychological ROS: negative for - behavioral disorder, hallucinations, memory difficulties, mood swings or suicidal  ideation Ophthalmic ROS: negative for - blurry vision, double vision, eye pain or loss of vision ENT ROS: negative for - epistaxis, nasal discharge, oral lesions, sore throat, tinnitus or vertigo Allergy and Immunology ROS: negative for - hives or itchy/watery eyes Hematological and Lymphatic ROS: negative for - bleeding problems, bruising or swollen lymph nodes Endocrine ROS: negative for - galactorrhea, hair pattern changes, polydipsia/polyuria or temperature intolerance Respiratory ROS: negative for - cough, hemoptysis, shortness of breath or wheezing Cardiovascular ROS: negative for - chest pain, dyspnea on exertion, edema or irregular heartbeat Gastrointestinal ROS: negative for - abdominal pain, diarrhea, hematemesis, nausea/vomiting or stool incontinence Genito-Urinary ROS: negative for - dysuria, hematuria, incontinence or urinary frequency/urgency Musculoskeletal ROS: negative for - joint swelling or muscular weakness Neurological ROS: as noted in HPI Dermatological ROS: negative for rash and skin lesion changes  Physical Examination: Blood pressure 122/89, pulse 66, temperature 98.1 F (36.7 C), temperature source Oral, resp. rate 10, height 5\' 9"  (1.753 m), weight 97.5 kg, SpO2 96 %.   Neurological Examination   Mental Status: Alert, oriented, thought content appropriate.  Speech fluent without evidence of aphasia.  Able to follow 3 step commands without difficulty. Cranial Nerves: II: Discs flat bilaterally; Visual fields grossly normal, pupils equal, round, reactive to light and accommodation III,IV, VI: ptosis not present, extra-ocular motions intact bilaterally V,VII: smile symmetric, facial light touch sensation normal bilaterally VIII: hearing normal bilaterally IX,X: gag reflex present XI: bilateral shoulder shrug XII: midline tongue extension Motor:  Right : Upper extremity   5/5    Left:     Upper extremity   5/5  Lower extremity   5/5     Lower extremity    5/5 Tone and bulk:normal tone throughout; no atrophy noted Sensory: L facial numbness subjective  Deep Tendon Reflexes: 1+ and symmetric throughout Plantars: Right: downgoing   Left: downgoing     Laboratory Studies:   Basic Metabolic Panel: Recent Labs  Lab 01/12/20 1931  NA 139  K 4.1  CL 100  CO2 28  GLUCOSE 96  BUN 21*  CREATININE 1.02  CALCIUM 9.6    Liver Function Tests: Recent Labs  Lab 01/12/20 1931  AST 20  ALT 18  ALKPHOS 62  BILITOT 0.8  PROT 7.9  ALBUMIN 4.7   No results for input(s): LIPASE, AMYLASE in the last 168 hours. No results for input(s): AMMONIA in the last 168 hours.  CBC: Recent Labs  Lab 01/12/20 1931  WBC 7.3  NEUTROABS 4.1  HGB 15.6  HCT 46.0  MCV 88.6  PLT 222    Cardiac Enzymes: No results for input(s): CKTOTAL, CKMB, CKMBINDEX, TROPONINI in the last 168 hours.  BNP: Invalid input(s): POCBNP  CBG: No results for input(s): GLUCAP in the last 168 hours.  Microbiology: Results for orders placed or performed during the hospital encounter of 08/07/19  SARS CORONAVIRUS 2 (TAT 6-24 HRS) Nasopharyngeal Nasopharyngeal Swab     Status: None   Collection Time: 08/07/19 12:22 PM   Specimen: Nasopharyngeal Swab  Result Value Ref Range Status   SARS Coronavirus 2 NEGATIVE NEGATIVE Final    Comment: (NOTE) SARS-CoV-2 target nucleic acids are NOT DETECTED. The SARS-CoV-2 RNA is generally detectable in upper and lower respiratory specimens during the acute phase of infection. Negative results do not preclude SARS-CoV-2 infection, do not rule out co-infections with other pathogens, and should not be used as the sole basis for treatment or other patient management decisions. Negative results must be combined with clinical observations, patient history, and epidemiological information. The expected result is Negative. Fact Sheet for Patients: SugarRoll.be Fact Sheet for Healthcare  Providers: https://www.woods-mathews.com/ This test is not yet approved or cleared by the [place] FDA and  has been authorized for detection and/or diagnosis of SARS-CoV-2 by FDA under an Emergency Use Authorization (EUA). This EUA will remain  in effect (meaning this test can be used) for the duration of the COVID-19 declaration under Section 56 4(b)(1) of the Act, 21 U.S.C. section 360bbb-3(b)(1), unless the authorization is terminated or revoked sooner. Performed at [facility], [place] [address] [place], [place] [address]     Coagulation Studies: Recent Labs    01/12/20 1931  LABPROT 12.7  INR 1.0    Urinalysis: No results for input(s): COLORURINE, LABSPEC, PHURINE, GLUCOSEU, HGBUR, BILIRUBINUR, KETONESUR, PROTEINUR, UROBILINOGEN, NITRITE, LEUKOCYTESUR in the last 168 hours.  Invalid input(s): APPERANCEUR  Lipid Panel:  No results found for: CHOL, TRIG, HDL, CHOLHDL, VLDL, LDLCALC  HgbA1C: No results found for: HGBA1C  Urine Drug Screen:  No results found for: LABOPIA, COCAINSCRNUR, LABBENZ, AMPHETMU, THCU, LABBARB  Alcohol Level: No results for input(s): ETH in the last 168 hours.  Other results: EKG: normal EKG, normal sinus rhythm, unchanged from previous tracings.  Imaging: CT HEAD WO CONTRAST  Result Date: 01/12/2020 CLINICAL DATA:  Right-sided facial numbness, right upper extremity numbness EXAM: CT HEAD WITHOUT CONTRAST TECHNIQUE: Contiguous axial images were obtained from the base of the skull through the vertex without  intravenous contrast. COMPARISON:  None. FINDINGS: Brain: No acute infarct or hemorrhage. Lateral ventricles and midline structures are unremarkable. No acute extra-axial fluid collections. No mass effect. Vascular: No hyperdense vessel or unexpected calcification. Skull: Normal. Negative for fracture or focal lesion. Sinuses/Orbits: No acute finding. Other: None. IMPRESSION: 1. No acute intracranial process. Electronically  Signed   By: Randa Ngo M.D.   On: 01/12/2020 19:46   MR BRAIN WO CONTRAST  Result Date: 01/13/2020 CLINICAL DATA:  Right-sided numbness EXAM: MRI HEAD WITHOUT CONTRAST TECHNIQUE: Multiplanar, multiecho pulse sequences of the brain and surrounding structures were obtained without intravenous contrast. COMPARISON:  None. FINDINGS: Brain: There is a thin 1.5 cm area mildly reduced diffusion at the left aspect of the midbrain including some involvement of the cerebral peduncle. There is no evidence of intracranial hemorrhage. There is no intracranial mass, mass effect, or edema. There is no hydrocephalus or extra-axial fluid collection. Ventricles and sulci are normal in size and configuration. Vascular: Major vessel flow voids at the skull base are preserved. Skull and upper cervical spine: Normal marrow signal is preserved. Sinuses/Orbits: Paranasal sinuses are aerated. Orbits are unremarkable. Other: Sella is unremarkable.  Mastoid air cells are clear. IMPRESSION: Small acute infarct of the left mid brain. Otherwise unremarkable study. Electronically Signed   By: Macy Mis M.D.   On: 01/13/2020 10:02     Assessment/Plan:   49 y.o. male  with history of hypertension and elevated BMI who presents for evaluation of right-sided numbness.  Patient reports that he woke up yesterday with right facial numbness and right arm numbness.  He reports that the arm numbness has resolved however the face is still present although significantly improved.  He denies any slurred speech, facial droop, gait instability, diplopia, dysarthria, dysphagia, any weakness.  No prior history of stroke but does have family history of such. No anti platelet use prior to admission   - small L midbrain stroke likely in setting of HTN - ASA 81mg  daily - statin - admission likely overnight - pt/ot - 2decho - carotid US - Likely d/c planning tomorrow AM if cleared by PT/ot 01/13/2020, 11:08 AM

## 2020-01-13 NOTE — Discharge Instructions (Addendum)
Take a baby aspirin daily and follow up with your doctor in 2-3 days for further TIA evaluation.  Return to the ED if you have facial droop, slurred speech, unilateral weakness or numbness, dizziness, headache, chest pain or shortness of breath.

## 2020-01-13 NOTE — ED Provider Notes (Signed)
I assumed care of this patient at approximately 0 700.  In brief patient presented for right facial and right arm paresthesias and reported of numbness that he noticed when he woke up yesterday at 6:15 in the morning.  Patient's arm numbness and paresthesias have improved since being in the ED but he still has some of his face.  CT head unremarkable.  Remainder of work-up unremarkable.  Plan is to obtain MRI brain to assess for evidence of CVA.  Please see frequent provider's full note for details regarding their initial full evaluation and work-up.  MRI brain with evidence of CVA.  Neurology service consulted.  Per neurology service they recommend admission to medicine service.  We will plan to admit to medicine for further evaluation management.   Lucrezia Starch, MD 01/13/20 1150

## 2020-01-13 NOTE — ED Notes (Signed)
Pt on phone for MRI screening.

## 2020-01-13 NOTE — ED Notes (Signed)
Pt back from MRI 

## 2020-01-14 ENCOUNTER — Observation Stay: Payer: 59

## 2020-01-14 DIAGNOSIS — Z20822 Contact with and (suspected) exposure to covid-19: Secondary | ICD-10-CM | POA: Diagnosis not present

## 2020-01-14 DIAGNOSIS — E785 Hyperlipidemia, unspecified: Secondary | ICD-10-CM | POA: Diagnosis not present

## 2020-01-14 DIAGNOSIS — I639 Cerebral infarction, unspecified: Secondary | ICD-10-CM | POA: Diagnosis not present

## 2020-01-14 DIAGNOSIS — I10 Essential (primary) hypertension: Secondary | ICD-10-CM | POA: Diagnosis not present

## 2020-01-14 DIAGNOSIS — I6523 Occlusion and stenosis of bilateral carotid arteries: Secondary | ICD-10-CM | POA: Diagnosis not present

## 2020-01-14 LAB — LIPID PANEL
Cholesterol: 165 mg/dL (ref 0–200)
HDL: 29 mg/dL — ABNORMAL LOW (ref >40)
LDL Cholesterol: 104 mg/dL — ABNORMAL HIGH (ref 0–99)
Total CHOL/HDL Ratio: 5.7 RATIO
Triglycerides: 158 mg/dL — ABNORMAL HIGH (ref <150)
VLDL: 32 mg/dL (ref 0–40)

## 2020-01-14 LAB — HIV ANTIBODY (ROUTINE TESTING W REFLEX): HIV Screen 4th Generation wRfx: NONREACTIVE

## 2020-01-14 LAB — ECHOCARDIOGRAM COMPLETE
Area-P 1/2: 2.99 cm2
P 1/2 time: 402 msec
S' Lateral: 3.2 cm

## 2020-01-14 LAB — HEMOGLOBIN A1C
Hgb A1c MFr Bld: 5.6 % (ref 4.8–5.6)
Mean Plasma Glucose: 114.02 mg/dL

## 2020-01-14 MED ORDER — ASPIRIN EC 81 MG PO TBEC: 81.0000 mg | DELAYED_RELEASE_TABLET | Freq: Every day | ORAL | 3 refills | Status: AC

## 2020-01-14 MED ORDER — ATORVASTATIN CALCIUM 40 MG PO TABS
40.0000 mg | ORAL_TABLET | Freq: Every day | ORAL | 11 refills | Status: AC
Start: 2020-01-14 — End: 2021-01-13

## 2020-01-14 NOTE — Progress Notes (Signed)
Subjective: Pt is back to baseline. No further numbness noted.   Past Medical History:  Diagnosis Date  . Hypertension   . Pectus excavatum    surgically repaired (age 49).  Wire removed (age 40)  . Plantar fasciitis    bilateral    Past Surgical History:  Procedure Laterality Date  . COLONOSCOPY WITH PROPOFOL N/A 08/11/2019   Procedure: COLONOSCOPY WITH BIOPSY;  Surgeon: Lucilla Lame, MD;  Location: [place];  Service: Endoscopy;  Laterality: N/A;  priority 4  . HERNIA REPAIR Bilateral 2013   inguinal  . PECTUS EXCAVATUM REPAIR     repair (age 23).  Wire removed (age 33)  . POLYPECTOMY N/A 08/11/2019   Procedure: POLYPECTOMY;  Surgeon: Lucilla Lame, MD;  Location: [place];  Service: Endoscopy;  Laterality: N/A;  . WISDOM TOOTH EXTRACTION      Family History  Problem Relation Age of Onset  . Heart Problems Father     Social History:  reports that he has never smoked. He has never used smokeless tobacco. He reports current alcohol use. He reports that he does not use drugs.  No Known Allergies  Medications: I have reviewed the patient's current medications.   Physical Examination: Blood pressure 125/70, pulse 78, temperature 98.4 F (36.9 C), temperature source Oral, resp. rate 18, height 5\' 9"  (1.753 m), weight 97.5 kg, SpO2 97 %.   Neurological Examination   Mental Status: Alert, oriented, thought content appropriate.  Speech fluent without evidence of aphasia.  Able to follow 3 step commands without difficulty. Cranial Nerves: II: Discs flat bilaterally; Visual fields grossly normal, pupils equal, round, reactive to light and accommodation III,IV, VI: ptosis not present, extra-ocular motions intact bilaterally V,VII: smile symmetric, facial light touch sensation normal bilaterally VIII: hearing normal bilaterally IX,X: gag reflex present XI: bilateral shoulder shrug XII: midline tongue extension Motor: Right : Upper extremity   5/5    Left:      Upper extremity   5/5  Lower extremity   5/5     Lower extremity   5/5 Tone and bulk:normal tone throughout; no atrophy noted Sensory: intact to light touch Deep Tendon Reflexes: 1+ and symmetric throughout Plantars: Right: downgoing   Left: downgoing     Laboratory Studies:   Basic Metabolic Panel: Recent Labs  Lab 01/12/20 1931  NA 139  K 4.1  CL 100  CO2 28  GLUCOSE 96  BUN 21*  CREATININE 1.02  CALCIUM 9.6    Liver Function Tests: Recent Labs  Lab 01/12/20 1931  AST 20  ALT 18  ALKPHOS 62  BILITOT 0.8  PROT 7.9  ALBUMIN 4.7   No results for input(s): LIPASE, AMYLASE in the last 168 hours. No results for input(s): AMMONIA in the last 168 hours.  CBC: Recent Labs  Lab 01/12/20 1931  WBC 7.3  NEUTROABS 4.1  HGB 15.6  HCT 46.0  MCV 88.6  PLT 222    Cardiac Enzymes: No results for input(s): CKTOTAL, CKMB, CKMBINDEX, TROPONINI in the last 168 hours.  BNP: Invalid input(s): POCBNP  CBG: No results for input(s): GLUCAP in the last 168 hours.  Microbiology: Results for orders placed or performed during the hospital encounter of 01/13/20  SARS Coronavirus 2 by RT PCR (hospital order, performed in [facility] hospital lab) Nasopharyngeal Nasopharyngeal Swab     Status: None   Collection Time: 01/13/20  2:34 PM   Specimen: Nasopharyngeal Swab  Result Value Ref Range Status   SARS Coronavirus 2  NEGATIVE NEGATIVE Final    Comment: (NOTE) SARS-CoV-2 target nucleic acids are NOT DETECTED.  The SARS-CoV-2 RNA is generally detectable in upper and lower respiratory specimens during the acute phase of infection. The lowest concentration of SARS-CoV-2 viral copies this assay can detect is 250 copies / mL. A negative result does not preclude SARS-CoV-2 infection and should not be used as the sole basis for treatment or other patient management decisions.  A negative result may occur with improper specimen collection / handling, submission of specimen  other than nasopharyngeal swab, presence of viral mutation(s) within the areas targeted by this assay, and inadequate number of viral copies (<250 copies / mL). A negative result must be combined with clinical observations, patient history, and epidemiological information.  Fact Sheet for Patients:   StrictlyIdeas.no  Fact Sheet for Healthcare Providers: BankingDealers.co.za  This test is not yet approved or  cleared by the [place] FDA and has been authorized for detection and/or diagnosis of SARS-CoV-2 by FDA under an Emergency Use Authorization (EUA).  This EUA will remain in effect (meaning this test can be used) for the duration of the COVID-19 declaration under Section 564(b)(1) of the Act, 21 U.S.C. section 360bbb-3(b)(1), unless the authorization is terminated or revoked sooner.  Performed at [facility], [address] [place], [place] [address]     Coagulation Studies: Recent Labs    01/12/20 1931  LABPROT 12.7  INR 1.0    Urinalysis: No results for input(s): COLORURINE, LABSPEC, PHURINE, GLUCOSEU, HGBUR, BILIRUBINUR, KETONESUR, PROTEINUR, UROBILINOGEN, NITRITE, LEUKOCYTESUR in the last 168 hours.  Invalid input(s): APPERANCEUR  Lipid Panel:     Component Value Date/Time   CHOL 165 01/14/2020 0436   TRIG 158 (H) 01/14/2020 0436   HDL 29 (L) 01/14/2020 0436   CHOLHDL 5.7 01/14/2020 0436   VLDL 32 01/14/2020 0436   LDLCALC 104 (H) 01/14/2020 0436    HgbA1C:  Lab Results  Component Value Date   HGBA1C 5.6 01/14/2020    Urine Drug Screen:  No results found for: LABOPIA, COCAINSCRNUR, LABBENZ, AMPHETMU, THCU, LABBARB  Alcohol Level: No results for input(s): ETH in the last 168 hours.  Other results: EKG: normal EKG, normal sinus rhythm, unchanged from previous tracings.  Imaging: CT HEAD WO CONTRAST  Result Date: 01/12/2020 CLINICAL DATA:  Right-sided facial numbness, right upper  extremity numbness EXAM: CT HEAD WITHOUT CONTRAST TECHNIQUE: Contiguous axial images were obtained from the base of the skull through the vertex without intravenous contrast. COMPARISON:  None. FINDINGS: Brain: No acute infarct or hemorrhage. Lateral ventricles and midline structures are unremarkable. No acute extra-axial fluid collections. No mass effect. Vascular: No hyperdense vessel or unexpected calcification. Skull: Normal. Negative for fracture or focal lesion. Sinuses/Orbits: No acute finding. Other: None. IMPRESSION: 1. No acute intracranial process. Electronically Signed   By: Randa Ngo M.D.   On: 01/12/2020 19:46   MR BRAIN WO CONTRAST  Result Date: 01/13/2020 CLINICAL DATA:  Right-sided numbness EXAM: MRI HEAD WITHOUT CONTRAST TECHNIQUE: Multiplanar, multiecho pulse sequences of the brain and surrounding structures were obtained without intravenous contrast. COMPARISON:  None. FINDINGS: Brain: There is a thin 1.5 cm area mildly reduced diffusion at the left aspect of the midbrain including some involvement of the cerebral peduncle. There is no evidence of intracranial hemorrhage. There is no intracranial mass, mass effect, or edema. There is no hydrocephalus or extra-axial fluid collection. Ventricles and sulci are normal in size and configuration. Vascular: Major vessel flow voids at the skull base  are preserved. Skull and upper cervical spine: Normal marrow signal is preserved. Sinuses/Orbits: Paranasal sinuses are aerated. Orbits are unremarkable. Other: Sella is unremarkable.  Mastoid air cells are clear. IMPRESSION: Small acute infarct of the left mid brain. Otherwise unremarkable study. Electronically Signed   By: Macy Mis M.D.   On: 01/13/2020 10:02   US Carotid Bilateral (at [facility] and [facility] only)  Result Date: 01/14/2020 CLINICAL DATA:  Left midbrain stroke. EXAM: BILATERAL CAROTID DUPLEX ULTRASOUND TECHNIQUE: Pearline Cables scale imaging, color Doppler and duplex ultrasound were performed  of bilateral carotid and vertebral arteries in the neck. COMPARISON:  MRI 01/13/2020 FINDINGS: Criteria: Quantification of carotid stenosis is based on velocity parameters that correlate the residual internal carotid diameter with NASCET-based stenosis levels, using the diameter of the distal internal carotid lumen as the denominator for stenosis measurement. The following velocity measurements were obtained: RIGHT ICA: 85/22 cm/sec CCA: 750/51 cm/sec SYSTOLIC ICA/CCA RATIO:  0.8 ECA: 63 cm/sec LEFT ICA: 60/24 cm/sec CCA: 833/58 cm/sec SYSTOLIC ICA/CCA RATIO:  0.5 ECA: 56 cm/sec RIGHT CAROTID ARTERY: Right carotid arteries are patent without significant plaque or stenosis. External carotid artery is patent with normal waveform. Normal waveforms and velocities in the internal carotid artery. RIGHT VERTEBRAL ARTERY: Antegrade flow and normal waveform in the right vertebral artery. LEFT CAROTID ARTERY: Left carotid arteries are patent without significant plaque or stenosis. External carotid artery is patent with normal waveform. Normal waveforms and velocities in the internal carotid artery. LEFT VERTEBRAL ARTERY: Antegrade flow and normal waveform in the left vertebral artery. IMPRESSION: Normal carotid artery duplex examination. Carotid arteries are patent without significant plaque or stenosis. Patent vertebral arteries with antegrade flow. Electronically Signed   By: Markus Daft M.D.   On: 01/14/2020 09:35     Assessment/Plan:   49 y.o. male  with history of hypertension and elevated BMI who presents for evaluation of right-sided numbness.  Patient reports that he woke up yesterday with right facial numbness and right arm numbness.  He reports that the arm numbness has resolved however the face is still present although significantly improved.  He denies any slurred speech, facial droop, gait instability, diplopia, dysarthria, dysphagia, any weakness.  No prior history of stroke but does have family history of  such. No anti platelet use prior to admission   - small L midbrain stroke likely in setting of HTN - ASA 81mg  daily - statin -  Pt/ot appreciated and signed off  - Normal carotid studies - d/c planning today 01/14/2020, 11:35 AM

## 2020-01-14 NOTE — Progress Notes (Signed)
OT Cancellation Note  Patient Details Name: Roger Reeves MRN: [id number] DOB: 1971-04-17   Cancelled Treatment:    Reason Eval/Treat Not Completed: OT screened, no needs identified, will sign off. Order received and chart reviewed. Pt reports feeling back to baseline level of independence for all ADL/IADL management. He endorses R arm and facial numbness has generally resolved other than a small amount of tingling at his lips and at the tip of his thumb. Pt states he has been up ad lib in room, has used the bathroom and showered independently. Pt denies any balance, visual, or strength deficits. Reviewed BE FAST stroke education. Pt return verbalizes understanding. No skilled OT needs identified. Will sign off at this time. Please re consult if additional OT needs arise during this admission. Thank you.   Shara Blazing, M.S., OTR/L Ascom: [phone] 01/14/20, 9:49 AM

## 2020-01-14 NOTE — Progress Notes (Signed)
PT Cancellation Note  Patient Details Name: Roger Reeves MRN: [id number] DOB: 01-26-1971   Cancelled Treatment:    Reason Eval/Treat Not Completed: Other (comment). Per OT, pt up and ambulatory in room with no needs. Pt feels safe and at baseline level. No acute PT needs identified at this time. Will cancel current order. Please re-consult if needs change.   Addilyn Satterwhite 01/14/2020, 9:45 AM  Greggory Stallion, PT, DPT [phone]

## 2020-01-14 NOTE — Progress Notes (Signed)
SLP Cancellation Note  Patient Details Name: [name] MRN: [id number] DOB: 10-10-1970   Cancelled treatment:       Reason Eval/Treat Not Completed: SLP screened, no needs identified, will sign off  Faheem Ziemann B. Rutherford Nail M.S., CCC-SLP, [place] Office [phone]   Antwane Grose Rutherford Nail 01/14/2020, 10:45 AM

## 2020-01-14 NOTE — Discharge Summary (Signed)
Physician Discharge Summary  Roger Reeves WGN:[id number] DOB: 1970/11/04   PCP: Idelle Crouch, MD  Admit date: 01/13/2020 Discharge date: 01/14/2020 Length of Stay: 0 days   Code Status: Full Code  Admitted From:  Home Discharged to:   [place]:  None  Equipment/Devices:  None Discharge Condition:  Stable  Recommendations for Outpatient Follow-up   1. Follow up with PCP in 1 week 2. Continue to encourage risk factor modification 3. Follow-up echo  Hospital Summary  This is a 49 year old male with past medical history of hypertension who presented with acute onset right-sided numbness of his right hand and right face.  ED Course: pt was found to have WBC 7.3, INR 1.0, PTT 28, negative COVID-19 PCR, temperature normal, blood pressure 122/89, heart rate 66, RR 10, oxygen saturation 96% on room air.  CT of head is negative for acute intracranial abnormalities.  MRI of her brain showed a small stroke in her left midbrain.  Neurology was consulted.  Recommended full stroke work-up with echo and lateral carotid ultrasounds.  Echo still pending discharge.  Lower quadrant ultrasound unremarkable.  Patient cleared for discharge by neurology and PT.  He had improvement in his right-sided sensory changes with only residual right thumb decrease sensation at discharge.  Started on aspirin 81 mg daily as well as Lipitor 40 mg daily and recommended outpatient follow-up.   A & P   Principal Problem:   Stroke [facility]) Active Problems:   Hypertension   1. Left midbrain acute infarct 1. Bilateral carotid ultrasound unremarkable 2. Echo pending at discharge, outpatient follow-up 3. Started on aspirin and statin 4. Cleared by neurology and PT for discharge  2. Hyperlipidemia 1. LDL 104, started on statin for above  3. Hypertension 1. Continue amlodipine    Consultants  . Neurology  Procedures  . None  Antibiotics   Anti-infectives (From admission, onward)   None        Subjective  Patient seen and examined at bedside no acute distress and resting comfortably.  No events overnight.  Tolerating diet. In good spirits and anticipating discharge.  States that he has some residual right thumb decreased sensation but otherwise feels back to normal  Denies any chest pain, shortness of breath, fever, nausea, vomiting, urinary or bowel complaints. Otherwise ROS negative    Objective   Discharge Exam: Vitals:   01/14/20 0739 01/14/20 1243  BP: 125/70 (!) 148/89  Pulse: 78 88  Resp: 18 18  Temp: 98.4 F (36.9 C) 98.2 F (36.8 C)  SpO2: 97% 97%   Vitals:   01/14/20 0052 01/14/20 0415 01/14/20 0739 01/14/20 1243  BP: (!) 107/56 113/61 125/70 (!) 148/89  Pulse: 60 66 78 88  Resp: 16 16 18 18   Temp: 97.6 F (36.4 C) 97.8 F (36.6 C) 98.4 F (36.9 C) 98.2 F (36.8 C)  TempSrc: Oral Oral Oral Oral  SpO2: 96% 94% 97% 97%  Weight:      Height:        Physical Exam Vitals and nursing note reviewed.  Constitutional:      Appearance: Normal appearance.  HENT:     Head: Normocephalic and atraumatic.  Eyes:     Conjunctiva/sclera: Conjunctivae normal.  Cardiovascular:     Rate and Rhythm: Normal rate and regular rhythm.  Pulmonary:     Effort: Pulmonary effort is normal.     Breath sounds: Normal breath sounds.  Abdominal:     General: Abdomen is flat.  Palpations: Abdomen is soft.  Musculoskeletal:        General: No swelling or tenderness.  Skin:    Coloration: Skin is not jaundiced or pale.  Neurological:     Mental Status: He is alert.     Cranial Nerves: No cranial nerve deficit.     Sensory: Sensory deficit present.     Motor: No weakness.     Deep Tendon Reflexes: Reflexes normal.     Comments: Decreased sensation of right thumb  Psychiatric:        Mood and Affect: Mood normal.        Behavior: Behavior normal.       The results of significant diagnostics from this hospitalization (including imaging, microbiology,  ancillary and laboratory) are listed below for reference.     Microbiology: Recent Results (from the past 240 hour(s))  SARS Coronavirus 2 by RT PCR (hospital order, performed in [facility] hospital lab) Nasopharyngeal Nasopharyngeal Swab     Status: None   Collection Time: 01/13/20  2:34 PM   Specimen: Nasopharyngeal Swab  Result Value Ref Range Status   SARS Coronavirus 2 NEGATIVE NEGATIVE Final    Comment: (NOTE) SARS-CoV-2 target nucleic acids are NOT DETECTED.  The SARS-CoV-2 RNA is generally detectable in upper and lower respiratory specimens during the acute phase of infection. The lowest concentration of SARS-CoV-2 viral copies this assay can detect is 250 copies / mL. A negative result does not preclude SARS-CoV-2 infection and should not be used as the sole basis for treatment or other patient management decisions.  A negative result may occur with improper specimen collection / handling, submission of specimen other than nasopharyngeal swab, presence of viral mutation(s) within the areas targeted by this assay, and inadequate number of viral copies (<250 copies / mL). A negative result must be combined with clinical observations, patient history, and epidemiological information.  Fact Sheet for Patients:   StrictlyIdeas.no  Fact Sheet for Healthcare Providers: BankingDealers.co.za  This test is not yet approved or  cleared by the [place] FDA and has been authorized for detection and/or diagnosis of SARS-CoV-2 by FDA under an Emergency Use Authorization (EUA).  This EUA will remain in effect (meaning this test can be used) for the duration of the COVID-19 declaration under Section 564(b)(1) of the Act, 21 U.S.C. section 360bbb-3(b)(1), unless the authorization is terminated or revoked sooner.  Performed at [facility], [address] [place], [place] [address]      Labs: BNP (last 3 results) No  results for input(s): BNP in the last 8760 hours. Basic Metabolic Panel: Recent Labs  Lab 01/12/20 1931  NA 139  K 4.1  CL 100  CO2 28  GLUCOSE 96  BUN 21*  CREATININE 1.02  CALCIUM 9.6   Liver Function Tests: Recent Labs  Lab 01/12/20 1931  AST 20  ALT 18  ALKPHOS 62  BILITOT 0.8  PROT 7.9  ALBUMIN 4.7   No results for input(s): LIPASE, AMYLASE in the last 168 hours. No results for input(s): AMMONIA in the last 168 hours. CBC: Recent Labs  Lab 01/12/20 1931  WBC 7.3  NEUTROABS 4.1  HGB 15.6  HCT 46.0  MCV 88.6  PLT 222   Cardiac Enzymes: No results for input(s): CKTOTAL, CKMB, CKMBINDEX, TROPONINI in the last 168 hours. BNP: Invalid input(s): POCBNP CBG: No results for input(s): GLUCAP in the last 168 hours. D-Dimer No results for input(s): DDIMER in the last 72 hours. Hgb A1c Recent Labs  01/14/20 0436  HGBA1C 5.6   Lipid Profile Recent Labs    01/14/20 0436  CHOL 165  HDL 29*  LDLCALC 104*  TRIG 158*  CHOLHDL 5.7   Thyroid function studies No results for input(s): TSH, T4TOTAL, T3FREE, THYROIDAB in the last 72 hours.  Invalid input(s): FREET3 Anemia work up No results for input(s): VITAMINB12, FOLATE, FERRITIN, TIBC, IRON, RETICCTPCT in the last 72 hours. Urinalysis No results found for: COLORURINE, APPEARANCEUR, [place], [place], [place], [place], [place], [place], PROTEINUR, UROBILINOGEN, NITRITE, LEUKOCYTESUR Sepsis Labs Invalid input(s): PROCALCITONIN,  WBC,  LACTICIDVEN Microbiology Recent Results (from the past 240 hour(s))  SARS Coronavirus 2 by RT PCR (hospital order, performed in [facility] hospital lab) Nasopharyngeal Nasopharyngeal Swab     Status: None   Collection Time: 01/13/20  2:34 PM   Specimen: Nasopharyngeal Swab  Result Value Ref Range Status   SARS Coronavirus 2 NEGATIVE NEGATIVE Final    Comment: (NOTE) SARS-CoV-2 target nucleic acids are NOT DETECTED.  The SARS-CoV-2 RNA is generally detectable in  upper and lower respiratory specimens during the acute phase of infection. The lowest concentration of SARS-CoV-2 viral copies this assay can detect is 250 copies / mL. A negative result does not preclude SARS-CoV-2 infection and should not be used as the sole basis for treatment or other patient management decisions.  A negative result may occur with improper specimen collection / handling, submission of specimen other than nasopharyngeal swab, presence of viral mutation(s) within the areas targeted by this assay, and inadequate number of viral copies (<250 copies / mL). A negative result must be combined with clinical observations, patient history, and epidemiological information.  Fact Sheet for Patients:   StrictlyIdeas.no  Fact Sheet for Healthcare Providers: BankingDealers.co.za  This test is not yet approved or  cleared by the [place] FDA and has been authorized for detection and/or diagnosis of SARS-CoV-2 by FDA under an Emergency Use Authorization (EUA).  This EUA will remain in effect (meaning this test can be used) for the duration of the COVID-19 declaration under Section 564(b)(1) of the Act, 21 U.S.C. section 360bbb-3(b)(1), unless the authorization is terminated or revoked sooner.  Performed at [facility], [address], [place], [place] [address]     Discharge Instructions     Discharge Instructions    Diet - low sodium heart healthy   Complete by: As directed    Discharge instructions   Complete by: As directed    - start taking aspirin 81 mg daily -Start taking Lipitor 40 mg daily -Follow-up with your primary care physician next week -Continue with lifestyle modification If you have any significant change or worsening of your symptoms, do not hesitate to contact your primary care physician or return to the ED.   Increase activity slowly   Complete by: As directed      Allergies as of  01/14/2020   No Known Allergies     Medication List    STOP taking these medications   naproxen sodium 220 MG tablet Commonly known as: ALEVE     TAKE these medications   amLODipine 5 MG tablet Commonly known as: NORVASC Take 5 mg by mouth daily.   aspirin EC 81 MG tablet Take 1 tablet (81 mg total) by mouth daily. Swallow whole.   atorvastatin 40 MG tablet Commonly known as: Lipitor Take 1 tablet (40 mg total) by mouth daily.       Follow-up Information    Sparks, Leonie Douglas, MD. Schedule an appointment  as soon as possible for a visit in 2 days.   Specialty: Internal Medicine Contact information: [place] [address] [phone]              No Known Allergies   Dispo: The patient is from: Home              Anticipated d/c is to: Home              Anticipated d/c date is: today              Patient currently is medically stable to d/c.       Time coordinating discharge: Over 30 minutes   SIGNED:   Harold Hedge, D.O. [facility] Pager: [phone]  01/14/2020, 1:47 PM

## 2020-01-19 MED ORDER — FENTANYL CITRATE (PF) 100 MCG/2ML IJ SOLN
INTRAMUSCULAR | Status: AC
  Filled 2020-01-19: qty 2

## 2020-01-19 MED ORDER — MIDAZOLAM HCL 5 MG/5ML IJ SOLN
INTRAMUSCULAR | Status: AC
  Filled 2020-01-19: qty 5

## 2020-01-22 DIAGNOSIS — E7800 Pure hypercholesterolemia, unspecified: Secondary | ICD-10-CM | POA: Diagnosis not present

## 2020-01-22 DIAGNOSIS — I639 Cerebral infarction, unspecified: Secondary | ICD-10-CM | POA: Diagnosis not present

## 2020-01-22 DIAGNOSIS — I10 Essential (primary) hypertension: Secondary | ICD-10-CM | POA: Diagnosis not present

## 2020-09-09 ENCOUNTER — Encounter: Payer: Self-pay | Admitting: Emergency Medicine

## 2020-09-09 ENCOUNTER — Emergency Department
Admission: EM | Admit: 2020-09-09 | Discharge: 2020-09-09 | Disposition: A | Discharge status: Home / Self Care | Payer: BC Managed Care – PPO | Attending: Emergency Medicine | Admitting: Emergency Medicine

## 2020-09-09 ENCOUNTER — Emergency Department: Payer: BC Managed Care – PPO

## 2020-09-09 ENCOUNTER — Other Ambulatory Visit: Payer: Self-pay

## 2020-09-09 DIAGNOSIS — J069 Acute upper respiratory infection, unspecified: Secondary | ICD-10-CM | POA: Insufficient documentation

## 2020-09-09 DIAGNOSIS — R059 Cough, unspecified: Secondary | ICD-10-CM | POA: Diagnosis not present

## 2020-09-09 DIAGNOSIS — R509 Fever, unspecified: Secondary | ICD-10-CM | POA: Diagnosis not present

## 2020-09-09 DIAGNOSIS — Z79899 Other long term (current) drug therapy: Secondary | ICD-10-CM | POA: Insufficient documentation

## 2020-09-09 DIAGNOSIS — Z20822 Contact with and (suspected) exposure to covid-19: Secondary | ICD-10-CM | POA: Diagnosis not present

## 2020-09-09 DIAGNOSIS — I10 Essential (primary) hypertension: Secondary | ICD-10-CM | POA: Diagnosis not present

## 2020-09-09 DIAGNOSIS — R0602 Shortness of breath: Secondary | ICD-10-CM | POA: Diagnosis present

## 2020-09-09 HISTORY — DX: Cerebral infarction, unspecified: I63.9

## 2020-09-09 LAB — BASIC METABOLIC PANEL
Anion gap: 10 (ref 5–15)
BUN: 15 mg/dL (ref 6–20)
CO2: 23 mmol/L (ref 22–32)
Calcium: 9 mg/dL (ref 8.9–10.3)
Chloride: 105 mmol/L (ref 98–111)
Creatinine, Ser: 1.03 mg/dL (ref 0.61–1.24)
GFR, Estimated: >60 mL/min (ref >60)
Glucose, Bld: 137 mg/dL — ABNORMAL HIGH (ref 70–99)
Potassium: 3.9 mmol/L (ref 3.5–5.1)
Sodium: 138 mmol/L (ref 135–145)

## 2020-09-09 LAB — CBC WITH DIFFERENTIAL/PLATELET
Abs Immature Granulocytes: 0.04 10*3/uL (ref 0.00–0.07)
Basophils Absolute: 0 10*3/uL (ref 0.0–0.1)
Basophils Relative: 0 %
Eosinophils Absolute: 0.1 10*3/uL (ref 0.0–0.5)
Eosinophils Relative: 1 %
HCT: 44.5 % (ref 39.0–52.0)
Hemoglobin: 15.5 g/dL (ref 13.0–17.0)
Immature Granulocytes: 0 %
Lymphocytes Relative: 10 %
Lymphs Abs: 1.1 10*3/uL (ref 0.7–4.0)
MCH: 30.3 pg (ref 26.0–34.0)
MCHC: 34.8 g/dL (ref 30.0–36.0)
MCV: 86.9 fL (ref 80.0–100.0)
Monocytes Absolute: 0.7 10*3/uL (ref 0.1–1.0)
Monocytes Relative: 6 %
Neutro Abs: 9.2 10*3/uL — ABNORMAL HIGH (ref 1.7–7.7)
Neutrophils Relative %: 83 %
Platelets: 205 10*3/uL (ref 150–400)
RBC: 5.12 MIL/uL (ref 4.22–5.81)
RDW: 12.6 % (ref 11.5–15.5)
WBC: 11.2 10*3/uL — ABNORMAL HIGH (ref 4.0–10.5)
nRBC: 0 % (ref 0.0–0.2)

## 2020-09-09 LAB — PROCALCITONIN: Procalcitonin: <0.1 ng/mL

## 2020-09-09 LAB — RESP PANEL BY RT-PCR (FLU A&B, COVID) ARPGX2
Influenza A by PCR: NEGATIVE
Influenza B by PCR: NEGATIVE
SARS Coronavirus 2 by RT PCR: NEGATIVE

## 2020-09-09 LAB — BRAIN NATRIURETIC PEPTIDE: B Natriuretic Peptide: 22.2 pg/mL (ref 0.0–100.0)

## 2020-09-09 MED ORDER — ACETYLCYSTEINE 20 % IN SOLN
4.0000 mL | Freq: Once | RESPIRATORY_TRACT | Status: AC
  Administered 2020-09-09: 4 mL via RESPIRATORY_TRACT
  Filled 2020-09-09: qty 4

## 2020-09-09 MED ORDER — ALBUTEROL SULFATE HFA 108 (90 BASE) MCG/ACT IN AERS: 2.0000 | INHALATION_SPRAY | Freq: Four times a day (QID) | RESPIRATORY_TRACT | 2 refills | Status: AC | PRN

## 2020-09-09 MED ORDER — PREDNISONE 10 MG (21) PO TBPK: ORAL_TABLET | ORAL | 0 refills | Status: AC

## 2020-09-09 MED ORDER — IPRATROPIUM-ALBUTEROL 0.5-2.5 (3) MG/3ML IN SOLN
3.0000 mL | Freq: Once | RESPIRATORY_TRACT | Status: AC
  Administered 2020-09-09: 3 mL via RESPIRATORY_TRACT
  Filled 2020-09-09: qty 3

## 2020-09-09 MED ORDER — ACETAMINOPHEN 500 MG PO TABS
1000.0000 mg | ORAL_TABLET | Freq: Once | ORAL | Status: AC
  Administered 2020-09-09: 1000 mg via ORAL
  Filled 2020-09-09: qty 2

## 2020-09-09 NOTE — ED Notes (Signed)
All documentation completed by Ricke Hey, SRN. Reviewed and approved by this RN.

## 2020-09-09 NOTE — ED Triage Notes (Signed)
Pt comes into the ED via [facility] c/o cough, sore throat, and swelling of the throat.  Pt presented to [facility] tachycardic, febrile, and low O2 sats.  Pt in NAD with even and unlabored respirations at this time.

## 2020-09-09 NOTE — ED Notes (Signed)
Pt electronic ED discharge e-signature paper printed, signed, and placed in med record pickup bin.

## 2020-09-09 NOTE — ED Provider Notes (Signed)
[facility] Emergency Department Provider Note   ____________________________________________   I have reviewed the triage vital signs and the nursing notes.   HISTORY  Chief Complaint Shortness of breath and cough  History limited by: Not Limited   HPI Roger Reeves is a 50 y.o. male who presents to the emergency department today with primary complaint of shortness of breath and cough.  Patient states symptoms have been going on for roughly 6 days.  The patient states that he feels that shortness of breath is worse with exertion or if he lies flat.  He has had a cough.  Feels like he has a lot of congestion in his lungs however has not been able to get a lot out.  Patient states that he has had fevers.  Did go to urgent care where a rapid COVID and flu were negative.  They did put him on prescription cough medicine which she states has not been particularly helpful.  Patient states that there was one time roughly 20 years ago when he required an inhaler however has not recently.  No recent travel.   Records reviewed. Per medical record review patient has a history of HTN, CVA.   Past Medical History:  Diagnosis Date  . Cerebral infarct ([place])   . Hypertension   . Pectus excavatum    surgically repaired (age 31).  Wire removed (age 1)  . Plantar fasciitis    bilateral    Patient Active Problem List   Diagnosis Date Noted  . Hypertension   . Stroke ([place])   . Special screening for malignant neoplasms, colon   . Polyp of descending colon   . Polyp of ascending colon     Past Surgical History:  Procedure Laterality Date  . COLONOSCOPY WITH PROPOFOL N/A 08/11/2019   Procedure: COLONOSCOPY WITH BIOPSY;  Surgeon: Lucilla Lame, MD;  Location: [facility];  Service: Endoscopy;  Laterality: N/A;  priority 4  . HERNIA REPAIR Bilateral 2013   inguinal  . PECTUS EXCAVATUM REPAIR     repair (age 89).  Wire removed (age 63)  . POLYPECTOMY N/A 08/11/2019    Procedure: POLYPECTOMY;  Surgeon: Lucilla Lame, MD;  Location: [facility];  Service: Endoscopy;  Laterality: N/A;  . WISDOM TOOTH EXTRACTION      Prior to Admission medications   Medication Sig Start Date End Date Taking? Authorizing Provider  amLODipine (NORVASC) 5 MG tablet Take 5 mg by mouth daily.  10/13/19 10/12/20  [provider]  atorvastatin (LIPITOR) 40 MG tablet Take 1 tablet (40 mg total) by mouth daily. 01/14/20 01/13/21  Harold Hedge, MD    Allergies Patient has no known allergies.  Family History  Problem Relation Age of Onset  . Heart Problems Father     Social History Social History   Tobacco Use  . Smoking status: Never Smoker  . Smokeless tobacco: Never Used  Vaping Use  . Vaping Use: Never used  Substance Use Topics  . Alcohol use: Yes    Comment: 2 beers/month  . Drug use: Never    Review of Systems Constitutional: Positive for fever. Eyes: No visual changes. ENT: Positive for sore throat. Cardiovascular: Denies chest pain. Respiratory: Positive for shortness of breath. Gastrointestinal: No abdominal pain.     Genitourinary: Negative for dysuria. Musculoskeletal: Negative for back pain. Skin: Negative for rash. Neurological: Negative for headaches, focal weakness or numbness.  ____________________________________________   PHYSICAL EXAM:  VITAL SIGNS: ED Triage Vitals  Enc Vitals Group     BP 09/09/20 0827 (!) 165/93     Pulse Rate 09/09/20 0827 (!) 131     Resp 09/09/20 0827 19     Temp 09/09/20 0827 (!) 100.4 F (38 C)     Temp Source 09/09/20 0827 Oral     SpO2 09/09/20 0827 92 %     Weight 09/09/20 0828 216 lb (98 kg)     Height 09/09/20 0828 5\' 9"  (1.753 m)     Head Circumference --      Peak Flow --      Pain Score 09/09/20 0827 8   Constitutional: Alert and oriented.  Eyes: Conjunctivae are normal.  ENT      Head: Normocephalic and atraumatic.      Nose: No congestion/rhinnorhea.      Mouth/Throat:  Mucous membranes are moist.      Neck: No stridor. Hematological/Lymphatic/Immunilogical: No cervical lymphadenopathy. Cardiovascular: Tachycardia, regular rhythm.  No murmurs, rubs, or gallops.  Respiratory: Normal respiratory effort without tachypnea nor retractions. Crackles in bilateral bases.  Gastrointestinal: Soft and non tender. No rebound. No guarding.  Genitourinary: Deferred Musculoskeletal: Normal range of motion in all extremities. No lower extremity edema. Neurologic:  Normal speech and language. No gross focal neurologic deficits are appreciated.  Skin:  Skin is warm, dry and intact. No rash noted. Psychiatric: Mood and affect are normal. Speech and behavior are normal. Patient exhibits appropriate insight and judgment.  ____________________________________________    LABS (pertinent positives/negatives)  CBC wbc 11.2, hgb 15.5, plt 205 BMP wnl except glu 137 COVID negative BNP 22.2 Procalcitonin <0.10 ____________________________________________   EKG  I, Nance Pear, attending physician, personally viewed and interpreted this EKG  EKG Time: 0833 Rate: 124 Rhythm: sinus tachycardia Axis: left axis deviation Intervals: qtc 399 QRS: narrow, low voltage  ST changes: no st elevation Impression: abnormal ekg  ____________________________________________    RADIOLOGY  CXR Bilateral lower airspace opacities concerning for infiltrates or edema.   ____________________________________________   PROCEDURES  Procedures  ____________________________________________   INITIAL IMPRESSION / ASSESSMENT AND PLAN / ED COURSE  Pertinent labs & imaging results that were available during my care of the patient were reviewed by me and considered in my medical decision making (see chart for details).   Patient presented to the emergency department today because of concerns for shortness of breath and cough.  CXR is concerning for possible infiltrate vs edema.  Blood work however without any concerning elevation of procalcitonin or BNP. At this time I think likely viral infection. The patient did feel better after breathing treatments and was able to lie down without significant cough. Will plan on discharging with albuterol inhaler and steroids. Discussed follow up with primary care.   ____________________________________________   FINAL CLINICAL IMPRESSION(S) / ED DIAGNOSES  Final diagnoses:  Viral URI with cough     Note: This dictation was prepared with Dragon dictation. Any transcriptional errors that result from this process are unintentional     Nance Pear, MD 09/09/20 1510

## 2020-09-09 NOTE — ED Notes (Signed)
Pharmacy contacted to send acetylcysteine

## 2020-09-09 NOTE — Discharge Instructions (Addendum)
Please seek medical attention for any high fevers, chest pain, shortness of breath, change in behavior, persistent vomiting, bloody stool or any other new or concerning symptoms.  

## 2021-08-08 IMAGING — US US CAROTID DUPLEX BILAT
1 series · 13 of 24 positions shown · non-contrast
Comparison: MRI 01/13/2020

CLINICAL DATA: Left midbrain stroke.

EXAM:
BILATERAL CAROTID DUPLEX ULTRASOUND
TECHNIQUE: Gray scale imaging, color Doppler and duplex ultrasound were
performed of bilateral carotid and vertebral arteries in the neck.

[Series 1: us carotid bilateral · 13 of 70 slices shown]
[im 1/70]
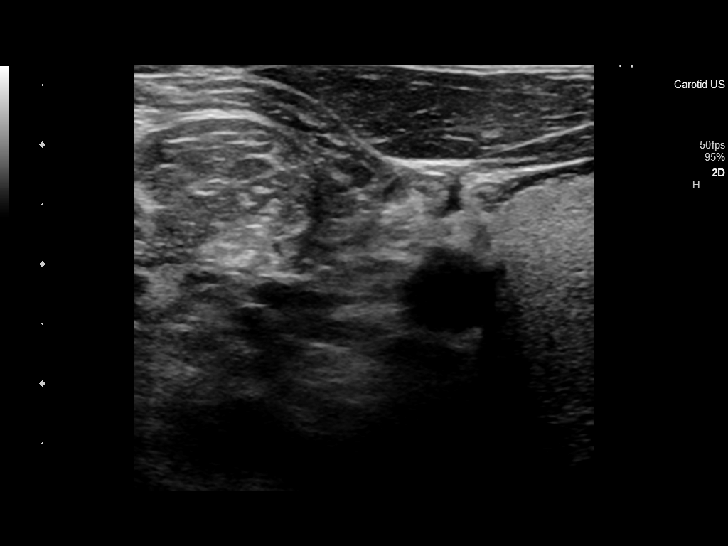
[im 7/70]
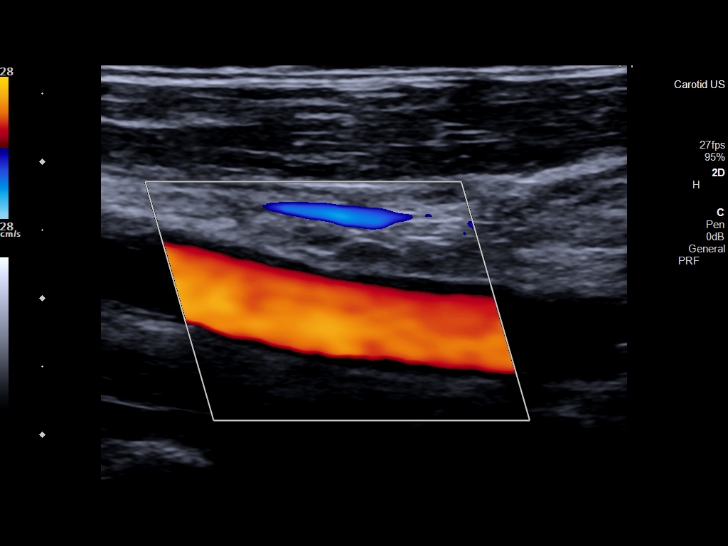
[im 13/70]
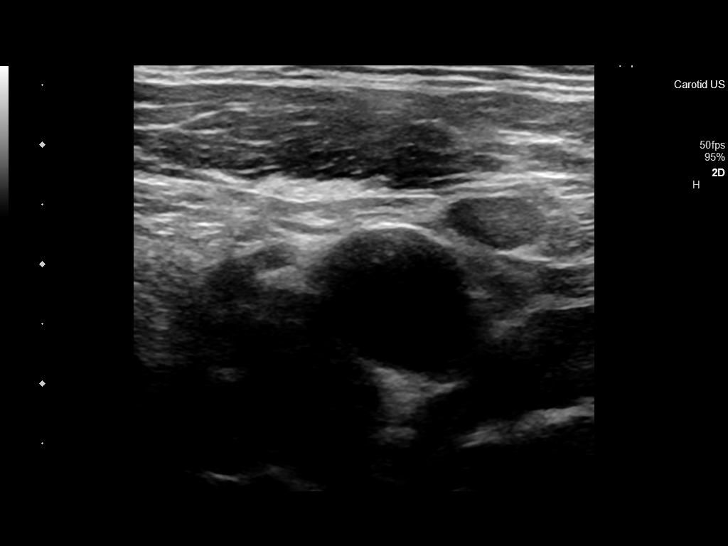
[im 19/70]
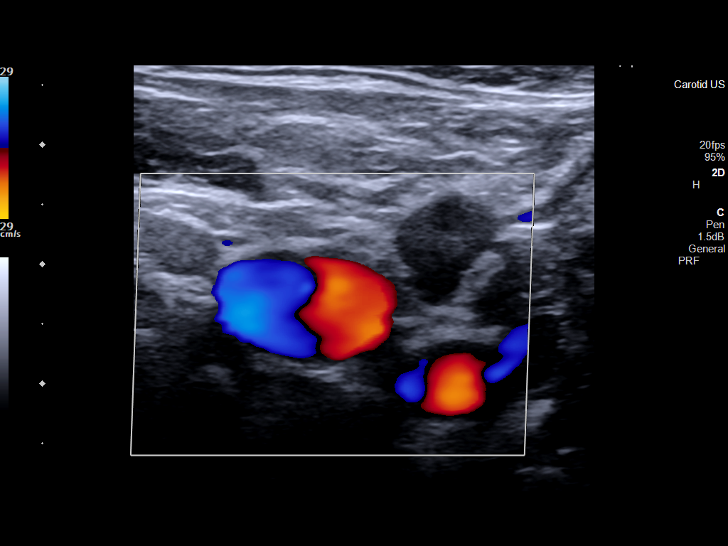
[im 25/70]
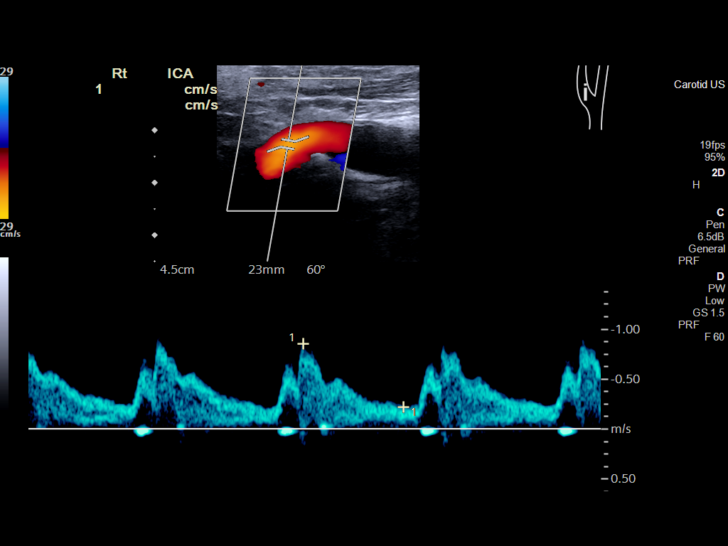
[im 31/70]
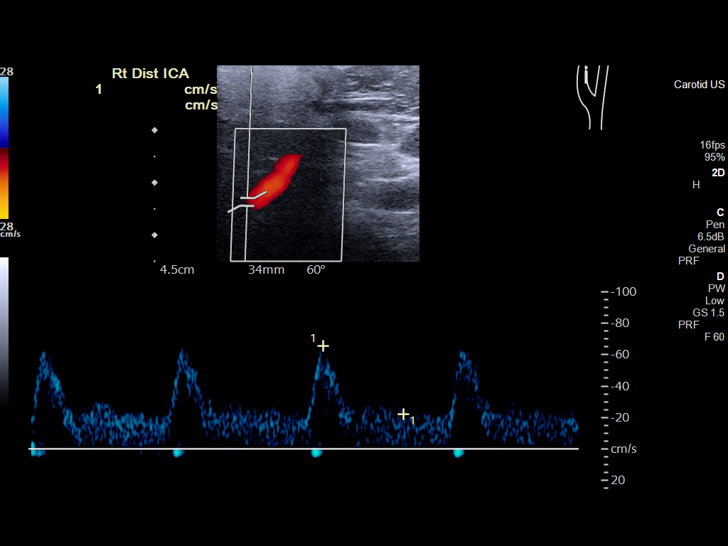
[im 37/70]
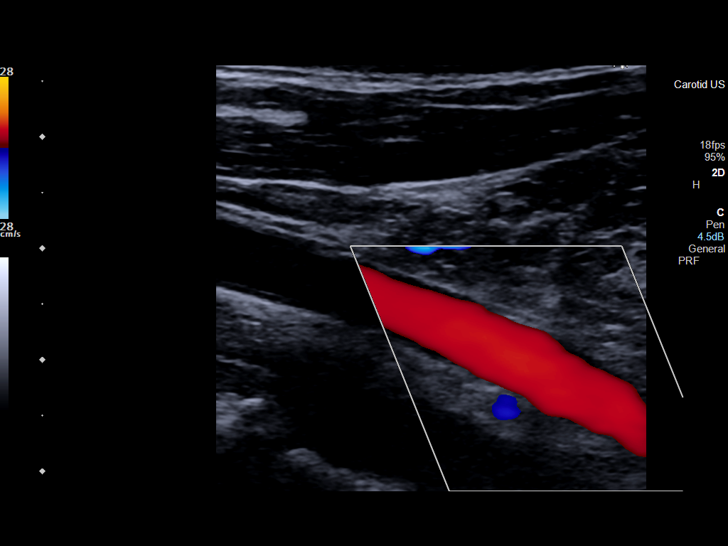
[im 40/70]
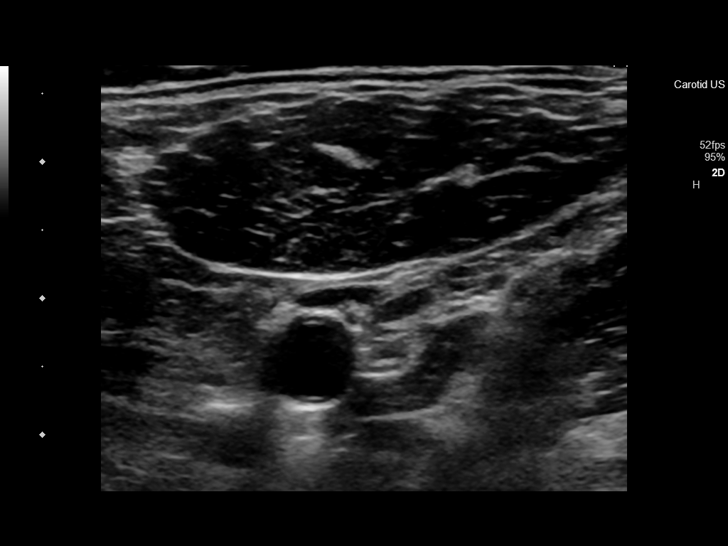
[im 46/70]
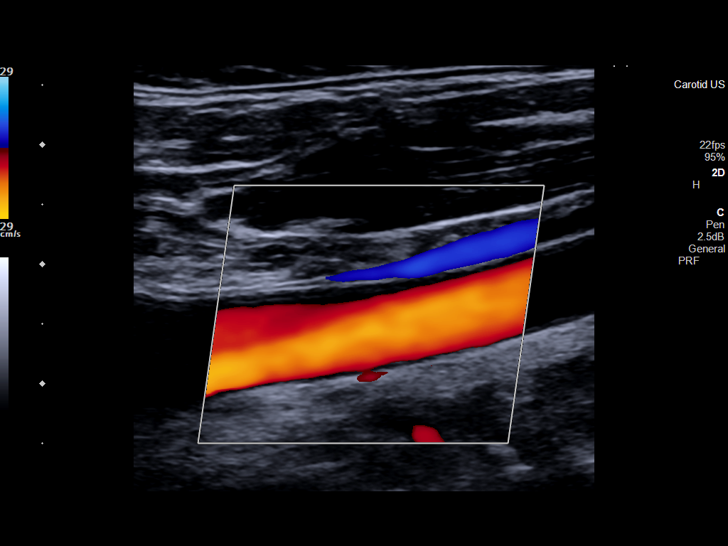
[im 52/70]
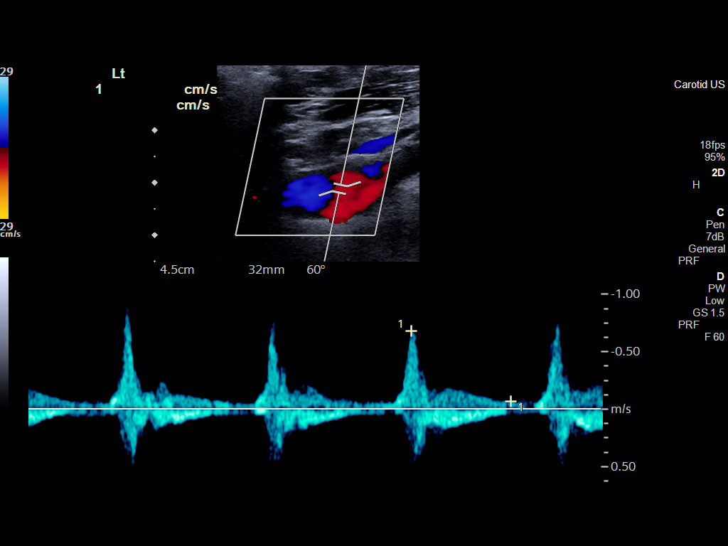
[im 58/70]
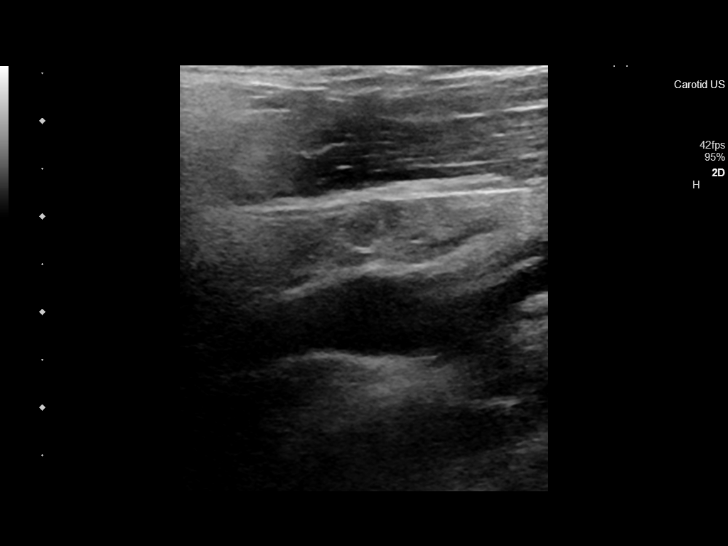
[im 64/70]
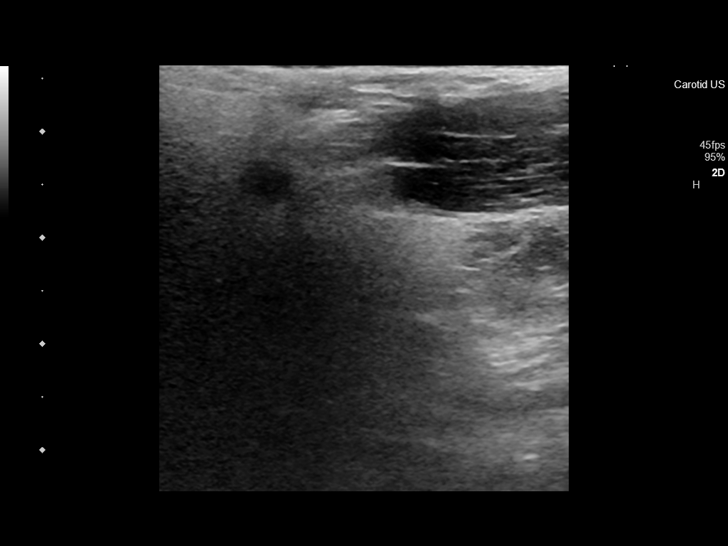
[im 70/70]
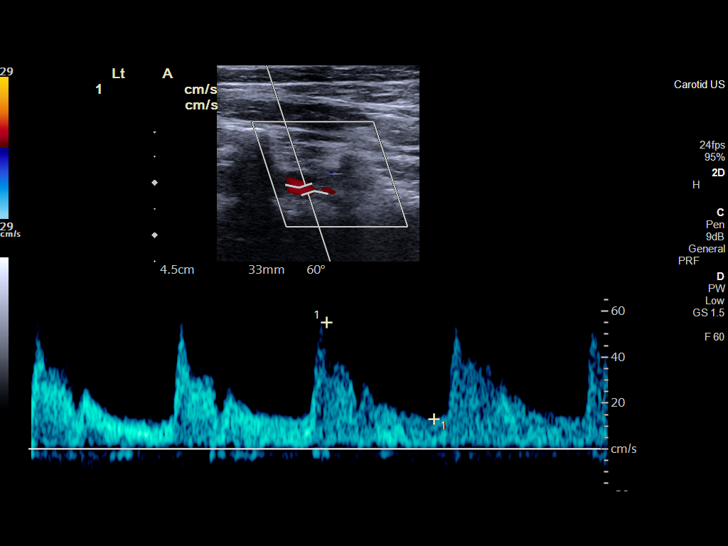

[13 of 24 positions shown; findings below may reference images not displayed]

FINDINGS: Criteria: Quantification of carotid stenosis is based on velocity
parameters that correlate the residual internal carotid diameter
with NASCET-based stenosis levels, using the diameter of the distal
internal carotid lumen as the denominator for stenosis measurement.

The following velocity measurements were obtained:

RIGHT

ICA: 85/22 cm/sec

CCA: 104/13 cm/sec

SYSTOLIC ICA/CCA RATIO:

ECA: 63 cm/sec

LEFT

ICA: 60/24 cm/sec

CCA: 116/17 cm/sec

SYSTOLIC ICA/CCA RATIO:

ECA: 56 cm/sec

RIGHT CAROTID ARTERY: Right carotid arteries are patent without
significant plaque or stenosis. External carotid artery is patent
with normal waveform. Normal waveforms and velocities in the
internal carotid artery.

RIGHT VERTEBRAL ARTERY: Antegrade flow and normal waveform in the
right vertebral artery.

LEFT CAROTID ARTERY: Left carotid arteries are patent without
significant plaque or stenosis. External carotid artery is patent
with normal waveform. Normal waveforms and velocities in the
internal carotid artery.

LEFT VERTEBRAL ARTERY: Antegrade flow and normal waveform in the
left vertebral artery.
IMPRESSION: Normal carotid artery duplex examination. Carotid arteries are
patent without significant plaque or stenosis.

Patent vertebral arteries with antegrade flow.
